# Patient Record
Sex: Male | Born: 1950 | Race: Black or African American | Hispanic: No | Marital: Married | Smoking: Former smoker
Health system: Southern US, Community
[De-identification: ages and names within clinical notes are randomized; demographics above are authoritative.]

## PROBLEM LIST (undated history)

## (undated) DIAGNOSIS — I10 Essential (primary) hypertension: Secondary | ICD-10-CM

---
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
## 2015-10-09 ENCOUNTER — Encounter (HOSPITAL_COMMUNITY): Payer: Self-pay | Admitting: *Deleted

## 2015-10-09 ENCOUNTER — Emergency Department (HOSPITAL_COMMUNITY)
Admission: EM | Admit: 2015-10-09 | Discharge: 2015-10-09 | Disposition: A | Discharge status: Home / Self Care | Payer: BLUE CROSS/BLUE SHIELD | Attending: Emergency Medicine | Admitting: Emergency Medicine

## 2015-10-09 ENCOUNTER — Emergency Department (HOSPITAL_COMMUNITY): Payer: BLUE CROSS/BLUE SHIELD

## 2015-10-09 DIAGNOSIS — F17200 Nicotine dependence, unspecified, uncomplicated: Secondary | ICD-10-CM | POA: Insufficient documentation

## 2015-10-09 DIAGNOSIS — J209 Acute bronchitis, unspecified: Secondary | ICD-10-CM | POA: Insufficient documentation

## 2015-10-09 DIAGNOSIS — R0602 Shortness of breath: Secondary | ICD-10-CM | POA: Diagnosis present

## 2015-10-09 LAB — CBC WITH DIFFERENTIAL/PLATELET
BASOS ABS: 0 10*3/uL (ref 0.0–0.1)
BASOS PCT: 0 %
EOS ABS: 0.2 10*3/uL (ref 0.0–0.7)
EOS PCT: 3 %
HCT: 38.6 % — ABNORMAL LOW (ref 39.0–52.0)
HEMOGLOBIN: 12.7 g/dL — AB (ref 13.0–17.0)
LYMPHS ABS: 3.8 10*3/uL (ref 0.7–4.0)
LYMPHS PCT: 50 %
MCH: 28.2 pg (ref 26.0–34.0)
MCHC: 32.9 g/dL (ref 30.0–36.0)
MCV: 85.6 fL (ref 78.0–100.0)
MONOS PCT: 7 %
Monocytes Absolute: 0.6 10*3/uL (ref 0.1–1.0)
NEUTROS PCT: 40 %
Neutro Abs: 3.1 10*3/uL (ref 1.7–7.7)
PLATELETS: 306 10*3/uL (ref 150–400)
RBC: 4.51 MIL/uL (ref 4.22–5.81)
RDW: 15.7 % — ABNORMAL HIGH (ref 11.5–15.5)
WBC: 7.7 10*3/uL (ref 4.0–10.5)

## 2015-10-09 LAB — COMPREHENSIVE METABOLIC PANEL
ALT: 14 U/L — AB (ref 17–63)
AST: 19 U/L (ref 15–41)
Albumin: 3.9 g/dL (ref 3.5–5.0)
Alkaline Phosphatase: 44 U/L (ref 38–126)
Anion gap: 9 (ref 5–15)
BUN: 16 mg/dL (ref 6–20)
CHLORIDE: 104 mmol/L (ref 101–111)
CO2: 26 mmol/L (ref 22–32)
CREATININE: 0.93 mg/dL (ref 0.61–1.24)
Calcium: 9 mg/dL (ref 8.9–10.3)
GFR calc non Af Amer: >60 mL/min (ref >60)
Glucose, Bld: 115 mg/dL — ABNORMAL HIGH (ref 65–99)
POTASSIUM: 4 mmol/L (ref 3.5–5.1)
SODIUM: 139 mmol/L (ref 135–145)
Total Bilirubin: 0.5 mg/dL (ref 0.3–1.2)
Total Protein: 7.5 g/dL (ref 6.5–8.1)

## 2015-10-09 LAB — BRAIN NATRIURETIC PEPTIDE: B NATRIURETIC PEPTIDE 5: 26 pg/mL (ref 0.0–100.0)

## 2015-10-09 LAB — TROPONIN I

## 2015-10-09 MED ORDER — IPRATROPIUM-ALBUTEROL 0.5-2.5 (3) MG/3ML IN SOLN
3.0000 mL | Freq: Once | RESPIRATORY_TRACT | Status: AC
  Administered 2015-10-09: 3 mL via RESPIRATORY_TRACT

## 2015-10-09 MED ORDER — ALBUTEROL SULFATE HFA 108 (90 BASE) MCG/ACT IN AERS
2.0000 | INHALATION_SPRAY | RESPIRATORY_TRACT | Status: DC | PRN
  Administered 2015-10-09: 2 via RESPIRATORY_TRACT
  Filled 2015-10-09: qty 6.7

## 2015-10-09 MED ORDER — ALBUTEROL SULFATE (2.5 MG/3ML) 0.083% IN NEBU
2.5000 mg | INHALATION_SOLUTION | Freq: Once | RESPIRATORY_TRACT | Status: AC
  Administered 2015-10-09: 2.5 mg via RESPIRATORY_TRACT

## 2015-10-09 MED ORDER — AEROCHAMBER Z-STAT PLUS/MEDIUM MISC: 1.0000 | Freq: Once | Status: DC

## 2015-10-09 MED ORDER — IPRATROPIUM-ALBUTEROL 0.5-2.5 (3) MG/3ML IN SOLN
3.0000 mL | Freq: Once | RESPIRATORY_TRACT | Status: AC
Start: 2015-10-09 — End: 2015-10-09
  Filled 2015-10-09: qty 3

## 2015-10-09 MED ORDER — AZITHROMYCIN 250 MG PO TABS: ORAL_TABLET | ORAL | Status: DC

## 2015-10-09 MED ORDER — PREDNISONE 20 MG PO TABS: ORAL_TABLET | ORAL | Status: DC

## 2015-10-09 MED ORDER — ALBUTEROL SULFATE (2.5 MG/3ML) 0.083% IN NEBU: 5.0000 mg | INHALATION_SOLUTION | Freq: Once | RESPIRATORY_TRACT | Status: DC

## 2015-10-09 MED ORDER — IPRATROPIUM-ALBUTEROL 0.5-2.5 (3) MG/3ML IN SOLN
RESPIRATORY_TRACT | Status: AC
  Administered 2015-10-09: 3 mL via RESPIRATORY_TRACT
  Filled 2015-10-09: qty 3

## 2015-10-09 MED ORDER — PREDNISONE 50 MG PO TABS
60.0000 mg | ORAL_TABLET | Freq: Once | ORAL | Status: AC
  Administered 2015-10-09: 60 mg via ORAL
  Filled 2015-10-09: qty 1

## 2015-10-09 MED ORDER — ALBUTEROL SULFATE (2.5 MG/3ML) 0.083% IN NEBU
INHALATION_SOLUTION | RESPIRATORY_TRACT | Status: AC
  Administered 2015-10-09: 2.5 mg via RESPIRATORY_TRACT
  Filled 2015-10-09: qty 3

## 2015-10-09 MED ORDER — IPRATROPIUM BROMIDE 0.02 % IN SOLN: 0.5000 mg | Freq: Once | RESPIRATORY_TRACT | Status: DC

## 2015-10-09 NOTE — ED Notes (Signed)
Pt c/o sob that started x 1 hour ago; pt states he has had a cold but tonight he became severely sob; pt states it feels like his throat is closing up

## 2015-10-09 NOTE — Discharge Instructions (Signed)
Drink plenty of fluids. You can take Mucinex DM over-the-counter for cough. Use the inhaler for wheezing or shortness of breath. Take the antibiotics until gone. Return to the ED if you feel worse such as getting a high fever, worsening shortness of breath, or chest pain.

## 2015-10-09 NOTE — ED Provider Notes (Signed)
CSN: [id number]     Arrival date & time 10/09/15  0009 History  By signing my name below, I, Hansel Feinstein, attest that this documentation has been prepared under the direction and in the presence of Rolland Porter, MD at [facility]. Electronically Signed: Hansel Feinstein, ED Scribe. 10/09/2015. 12:52 AM.    Chief Complaint  Patient presents with  . Shortness of Breath   The history is provided by the patient. No language interpreter was used.    HPI Comments: Carlos Vargas is a 64 y.o. male who presents to the Emergency Department complaining of moderate, intermittent, productive cough with white phlegm onset 2 weeks ago. He states associated SOB onset tonight that woke him from sleep at 2330, generalized chest and throat tightness onset this morning, wheezing, rhinorrhea with clear drainage, mild sore throat. No Hx of similar symptoms or wheezing. Pt states significant improvement of his symptoms with a breathing treatment administered in the ED. No daily medications. Pt is a daily smoker, 3 cigarettes/day. Pt does environmental work.  He denies fever, nausea, vomiting, diarrhea, abdominal pain, leg swelling.   PCP none  History reviewed. No pertinent past medical history. History reviewed. No pertinent past surgical history. History reviewed. No pertinent family history. Social History  Substance Use Topics  . Smoking status: Current Every Day Smoker -- 0.33 packs/day  . Smokeless tobacco: None  . Alcohol Use: No  employed Smokes 3 cigarettes a day  Review of Systems  Constitutional: Negative for fever.  HENT: Positive for rhinorrhea and sore throat.   Respiratory: Positive for cough, chest tightness, shortness of breath and wheezing.   Cardiovascular: Negative for leg swelling.  Gastrointestinal: Negative for nausea, vomiting and diarrhea.  All other systems reviewed and are negative.  Allergies  Review of patient's allergies indicates no known allergies.  Home Medications   Prior to Admission  medications   Medication Sig Start Date End Date Taking? Authorizing Provider  azithromycin (ZITHROMAX) 250 MG tablet Take 2 po the first day then once a day for the next 4 days. 10/09/15   Rolland Porter, MD  predniSONE (DELTASONE) 20 MG tablet Take 3 po QD x 3d , then 2 po QD x 3d then 1 po QD x 3d 10/09/15   Rolland Porter, MD   Triage VS: BP 156/87 mmHg  Pulse 64  Temp(Src) 98.2 F (36.8 C) (Oral)  Resp 20  SpO2 95% on RA  Vital signs normal   Physical Exam  Constitutional: He is oriented to person, place, and time. He appears well-developed and well-nourished.  Non-toxic appearance. He does not appear ill. No distress.  HENT:  Head: Normocephalic and atraumatic.  Right Ear: External ear normal.  Left Ear: External ear normal.  Nose: Nose normal. No mucosal edema or rhinorrhea.  Mouth/Throat: Oropharynx is clear and moist and mucous membranes are normal. No dental abscesses or uvula swelling.  Eyes: Conjunctivae and EOM are normal. Pupils are equal, round, and reactive to light.  Neck: Normal range of motion and full passive range of motion without pain. Neck supple.  Cardiovascular: Normal rate, regular rhythm and normal heart sounds.  Exam reveals no gallop and no friction rub.   No murmur heard. Pulmonary/Chest: Effort normal. No respiratory distress. He has wheezes. He has no rhonchi. He has no rales. He exhibits no tenderness and no crepitus.  My exam was after his first nebulizer treatment. Diffuse expiratory wheezing.   Abdominal: Soft. Normal appearance and bowel sounds are normal. He exhibits no distension. There  is no tenderness. There is no rebound and no guarding.  Musculoskeletal: Normal range of motion. He exhibits no edema or tenderness.  Moves all extremities well.   Neurological: He is alert and oriented to person, place, and time. He has normal strength. No cranial nerve deficit.  Skin: Skin is warm, dry and intact. No rash noted. No erythema. No pallor.  Psychiatric: He  has a normal mood and affect. His speech is normal and behavior is normal. His mood appears not anxious.  Nursing note and vitals reviewed.  ED Course  Procedures (including critical care time)  Medications  albuterol (PROVENTIL HFA;VENTOLIN HFA) 108 (90 BASE) MCG/ACT inhaler 2 puff (not administered)  aerochamber Z-Stat Plus/medium 1 each (not administered)  ipratropium-albuterol (DUONEB) 0.5-2.5 (3) MG/3ML nebulizer solution 3 mL (3 mLs Nebulization Given 10/09/15 0036)  ipratropium-albuterol (DUONEB) 0.5-2.5 (3) MG/3ML nebulizer solution 3 mL (0 mLs Nebulization Duplicate [id number])  predniSONE (DELTASONE) tablet 60 mg (60 mg Oral Given 10/09/15 0122)  ipratropium-albuterol (DUONEB) 0.5-2.5 (3) MG/3ML nebulizer solution 3 mL (3 mLs Nebulization Given 10/09/15 0055)  albuterol (PROVENTIL) (2.5 MG/3ML) 0.083% nebulizer solution 2.5 mg (2.5 mg Nebulization Given 10/09/15 0055)    DIAGNOSTIC STUDIES: Oxygen Saturation is 95% on [place] 2L/min, adequate by my interpretation.    COORDINATION OF CARE: 12:46 AM Discussed treatment plan with pt at bedside and pt agreed to plan. Patient had RD received one nebulizer by nursing staff. He received a second nebulizer plus prednisone. He had a chest x-ray ordered to rule out pneumonia.   Recheck at 1:40 AM patient states he's feeling much better. His lungs are now clear. Patient is eager to be discharged. He was discharged home with inhaler. He was discharged with prednisone and a Z-Pak.   Labs Review Results for orders placed or performed during the hospital encounter of 10/09/15  Comprehensive metabolic panel  Result Value Ref Range   Sodium 139 135 - 145 mmol/L   Potassium 4.0 3.5 - 5.1 mmol/L   Chloride 104 101 - 111 mmol/L   CO2 26 22 - 32 mmol/L   Glucose, Bld 115 (H) 65 - 99 mg/dL   BUN 16 6 - 20 mg/dL   Creatinine, Ser 0.93 0.61 - 1.24 mg/dL   Calcium 9.0 8.9 - 10.3 mg/dL   Total Protein 7.5 6.5 - 8.1 g/dL   Albumin 3.9 3.5 - 5.0 g/dL    AST 19 15 - 41 U/L   ALT 14 (L) 17 - 63 U/L   Alkaline Phosphatase 44 38 - 126 U/L   Total Bilirubin 0.5 0.3 - 1.2 mg/dL   GFR calc non Af Amer >60 >60 mL/min   GFR calc Af Amer >60 >60 mL/min   Anion gap 9 5 - 15  CBC with Differential  Result Value Ref Range   WBC 7.7 4.0 - 10.5 K/uL   RBC 4.51 4.22 - 5.81 MIL/uL   Hemoglobin 12.7 (L) 13.0 - 17.0 g/dL   HCT 38.6 (L) 39.0 - 52.0 %   MCV 85.6 78.0 - 100.0 fL   MCH 28.2 26.0 - 34.0 pg   MCHC 32.9 30.0 - 36.0 g/dL   RDW 15.7 (H) 11.5 - 15.5 %   Platelets 306 150 - 400 K/uL   Neutrophils Relative % 40 %   Neutro Abs 3.1 1.7 - 7.7 K/uL   Lymphocytes Relative 50 %   Lymphs Abs 3.8 0.7 - 4.0 K/uL   Monocytes Relative 7 %   Monocytes Absolute 0.6 0.1 -  1.0 K/uL   Eosinophils Relative 3 %   Eosinophils Absolute 0.2 0.0 - 0.7 K/uL   Basophils Relative 0 %   Basophils Absolute 0.0 0.0 - 0.1 K/uL  Troponin I  Result Value Ref Range   Troponin I <0.03 <0.031 ng/mL  Brain natriuretic peptide  Result Value Ref Range   B Natriuretic Peptide 26.0 0.0 - 100.0 pg/mL   Laboratory interpretation all normal except anemia     Imaging Review Dg Chest 2 View  10/09/2015  CLINICAL DATA:  Subacute onset of moderate intermittent productive cough, and acute shortness of breath. Generalized chest and throat tightness. Wheezing and rhinorrhea. Mild sore throat. Initial encounter. EXAM: CHEST  2 VIEW COMPARISON:  None. FINDINGS: The lungs are well-aerated. Mild peribronchial thickening is noted. There is no evidence of focal opacification, pleural effusion or pneumothorax. The heart is normal in size; the mediastinal contour is within normal limits. No acute osseous abnormalities are seen. IMPRESSION: Mild peribronchial thickening noted.  Lungs otherwise clear. Electronically Signed   By: Garald Balding M.D.   On: 10/09/2015 01:07   I have personally reviewed and evaluated these images and lab results as part of my medical decision-making.   EKG  Interpretation   Date/Time:  Tuesday October 09 2015 00:29:00 EST Ventricular Rate:  71 PR Interval:  216 QRS Duration: 90 QT Interval:  368 QTC Calculation: 400 R Axis:   80 Text Interpretation:  Sinus rhythm Borderline prolonged PR interval  Borderline low voltage, extremity leads Anteroseptal infarct, old Baseline  wander No old tracing to compare Confirmed by   MD-I,  ([id number]) on  10/09/2015 12:32:12 AM      MDM   patient presents with bronchitis for about 2 weeks now with bronchospasm which he's never had before. He improved rapidly with a albuterol/Atrovent nebulizer treatments 2. Labs were done he does not have evidence of congestive heart failure either on blood work or x-ray or acute myocardial event. Patient was discharged with an inhaler and antibiotics with steroids for his bronchospasm    Final diagnoses:  Bronchitis, acute, with bronchospasm   New Prescriptions   AZITHROMYCIN (ZITHROMAX) 250 MG TABLET    Take 2 po the first day then once a day for the next 4 days.   PREDNISONE (DELTASONE) 20 MG TABLET    Take 3 po QD x 3d , then 2 po QD x 3d then 1 po QD x 3d    Plan discharge  Rolland Porter, MD, FACEP   I personally performed the services described in this documentation, which was scribed in my presence. The recorded information has been reviewed and considered.  Rolland Porter, MD, Barbette Or, MD 10/09/15 [phone]

## 2017-07-20 ENCOUNTER — Encounter (HOSPITAL_COMMUNITY): Payer: Self-pay | Admitting: Emergency Medicine

## 2017-07-20 DIAGNOSIS — Z5321 Procedure and treatment not carried out due to patient leaving prior to being seen by health care provider: Secondary | ICD-10-CM | POA: Insufficient documentation

## 2017-07-20 DIAGNOSIS — K0889 Other specified disorders of teeth and supporting structures: Secondary | ICD-10-CM | POA: Diagnosis present

## 2017-07-20 NOTE — ED Triage Notes (Signed)
Pt c/o left sided dental pain that started today. He has been taking aleve with no relief.

## 2017-07-21 ENCOUNTER — Emergency Department (HOSPITAL_COMMUNITY)
Admission: EM | Admit: 2017-07-21 | Discharge: 2017-07-21 | Disposition: A | Discharge status: Home / Self Care | Payer: BLUE CROSS/BLUE SHIELD | Attending: Emergency Medicine | Admitting: Emergency Medicine

## 2021-04-09 ENCOUNTER — Emergency Department (HOSPITAL_COMMUNITY)
Admission: EM | Admit: 2021-04-09 | Discharge: 2021-04-09 | Disposition: A | Discharge status: Home / Self Care | Payer: Medicare HMO | Attending: Emergency Medicine | Admitting: Emergency Medicine

## 2021-04-09 ENCOUNTER — Emergency Department (HOSPITAL_COMMUNITY): Payer: Medicare HMO

## 2021-04-09 ENCOUNTER — Other Ambulatory Visit: Payer: Self-pay

## 2021-04-09 ENCOUNTER — Encounter (HOSPITAL_COMMUNITY): Payer: Self-pay | Admitting: Emergency Medicine

## 2021-04-09 DIAGNOSIS — R0602 Shortness of breath: Secondary | ICD-10-CM | POA: Insufficient documentation

## 2021-04-09 DIAGNOSIS — R0789 Other chest pain: Secondary | ICD-10-CM | POA: Insufficient documentation

## 2021-04-09 DIAGNOSIS — Z20822 Contact with and (suspected) exposure to covid-19: Secondary | ICD-10-CM | POA: Insufficient documentation

## 2021-04-09 DIAGNOSIS — R062 Wheezing: Secondary | ICD-10-CM | POA: Diagnosis not present

## 2021-04-09 DIAGNOSIS — Z87891 Personal history of nicotine dependence: Secondary | ICD-10-CM | POA: Insufficient documentation

## 2021-04-09 LAB — CBC WITH DIFFERENTIAL/PLATELET
Abs Immature Granulocytes: 0.01 10*3/uL (ref 0.00–0.07)
Basophils Absolute: 0 10*3/uL (ref 0.0–0.1)
Basophils Relative: 1 %
Eosinophils Absolute: 0.3 10*3/uL (ref 0.0–0.5)
Eosinophils Relative: 6 %
HCT: 41.4 % (ref 39.0–52.0)
Hemoglobin: 13.3 g/dL (ref 13.0–17.0)
Immature Granulocytes: 0 %
Lymphocytes Relative: 41 %
Lymphs Abs: 2.1 10*3/uL (ref 0.7–4.0)
MCH: 28.5 pg (ref 26.0–34.0)
MCHC: 32.1 g/dL (ref 30.0–36.0)
MCV: 88.7 fL (ref 80.0–100.0)
Monocytes Absolute: 0.5 10*3/uL (ref 0.1–1.0)
Monocytes Relative: 10 %
Neutro Abs: 2.2 10*3/uL (ref 1.7–7.7)
Neutrophils Relative %: 42 %
Platelets: 230 10*3/uL (ref 150–400)
RBC: 4.67 MIL/uL (ref 4.22–5.81)
RDW: 14.3 % (ref 11.5–15.5)
WBC: 5.2 10*3/uL (ref 4.0–10.5)
nRBC: 0 % (ref 0.0–0.2)

## 2021-04-09 LAB — BASIC METABOLIC PANEL
Anion gap: 6 (ref 5–15)
BUN: 10 mg/dL (ref 8–23)
CO2: 30 mmol/L (ref 22–32)
Calcium: 9 mg/dL (ref 8.9–10.3)
Chloride: 104 mmol/L (ref 98–111)
Creatinine, Ser: 0.88 mg/dL (ref 0.61–1.24)
GFR, Estimated: >60 mL/min (ref >60)
Glucose, Bld: 102 mg/dL — ABNORMAL HIGH (ref 70–99)
Potassium: 4.7 mmol/L (ref 3.5–5.1)
Sodium: 140 mmol/L (ref 135–145)

## 2021-04-09 LAB — RESP PANEL BY RT-PCR (FLU A&B, COVID) ARPGX2
Influenza A by PCR: NEGATIVE
Influenza B by PCR: NEGATIVE
SARS Coronavirus 2 by RT PCR: NEGATIVE

## 2021-04-09 LAB — TROPONIN I (HIGH SENSITIVITY): Troponin I (High Sensitivity): 2 ng/L (ref <18)

## 2021-04-09 LAB — BRAIN NATRIURETIC PEPTIDE: B Natriuretic Peptide: 18 pg/mL (ref 0.0–100.0)

## 2021-04-09 MED ORDER — PREDNISONE 50 MG PO TABS
60.0000 mg | ORAL_TABLET | Freq: Once | ORAL | Status: AC
  Administered 2021-04-09: 60 mg via ORAL
  Filled 2021-04-09: qty 1

## 2021-04-09 MED ORDER — ALBUTEROL SULFATE (2.5 MG/3ML) 0.083% IN NEBU
5.0000 mg | INHALATION_SOLUTION | Freq: Once | RESPIRATORY_TRACT | Status: AC
  Administered 2021-04-09: 5 mg via RESPIRATORY_TRACT

## 2021-04-09 MED ORDER — ALBUTEROL SULFATE HFA 108 (90 BASE) MCG/ACT IN AERS: 2.0000 | INHALATION_SPRAY | RESPIRATORY_TRACT | 1 refills | Status: DC | PRN

## 2021-04-09 MED ORDER — ALBUTEROL SULFATE HFA 108 (90 BASE) MCG/ACT IN AERS
2.0000 | INHALATION_SPRAY | RESPIRATORY_TRACT | Status: DC | PRN
  Administered 2021-04-09: 2 via RESPIRATORY_TRACT
  Filled 2021-04-09: qty 6.7

## 2021-04-09 MED ORDER — ALBUTEROL SULFATE (2.5 MG/3ML) 0.083% IN NEBU
5.0000 mg | INHALATION_SOLUTION | Freq: Once | RESPIRATORY_TRACT | Status: AC
  Administered 2021-04-09: 5 mg via RESPIRATORY_TRACT
  Filled 2021-04-09: qty 6

## 2021-04-09 MED ORDER — IPRATROPIUM BROMIDE 0.02 % IN SOLN
0.5000 mg | Freq: Once | RESPIRATORY_TRACT | Status: AC
  Administered 2021-04-09: 0.5 mg via RESPIRATORY_TRACT

## 2021-04-09 MED ORDER — IPRATROPIUM BROMIDE 0.02 % IN SOLN
0.5000 mg | Freq: Once | RESPIRATORY_TRACT | Status: AC
  Administered 2021-04-09: 0.5 mg via RESPIRATORY_TRACT
  Filled 2021-04-09: qty 2.5

## 2021-04-09 NOTE — ED Provider Notes (Addendum)
[facility] EMERGENCY DEPARTMENT Provider Note   CSN: [id number] Arrival date & time: 04/09/21  5573     History Chief Complaint  Patient presents with  . Shortness of Breath    Carlos Vargas is a 70 y.o. male.  Patient c/o sob onset this AM. States hx intermittent wheezing in past, but has no inhaler. Early this AM felt wheezing, sob, and chest tightness. Symptoms at rest, acute onset, moderate, constant, persistent. No exertional chest pain or discomfort. No pleuritic pain. No leg pain or swelling. Occasional non prod cough. No sore throat. No fever or chills. No specific known ill contacts. Occasional smoker. Denies any other recent chest pain or discomfort, no unusual doe or fatigue. No associated nv or diaphoresis. No recent surgery, travel, trauma or immobility. No hemoptysis.   The history is provided by the patient.  Shortness of Breath Associated symptoms: chest pain, cough and wheezing   Associated symptoms: no abdominal pain, no fever, no headaches, no neck pain, no rash, no sore throat and no vomiting        History reviewed. No pertinent past medical history.  There are no problems to display for this patient.   History reviewed. No pertinent surgical history.     No family history on file.  Social History   Tobacco Use  . Smoking status: Former Smoker    Packs/day: 0.33  . Smokeless tobacco: Never Used  Substance Use Topics  . Alcohol use: No  . Drug use: No    Home Medications Prior to Admission medications   Medication Sig Start Date End Date Taking? Authorizing Provider  azithromycin (ZITHROMAX) 250 MG tablet Take 2 po the first day then once a day for the next 4 days. 10/09/15   Rolland Porter, MD  predniSONE (DELTASONE) 20 MG tablet Take 3 po QD x 3d , then 2 po QD x 3d then 1 po QD x 3d 10/09/15   Rolland Porter, MD    Allergies    Patient has no known allergies.  Review of Systems   Review of Systems  Constitutional: Negative for chills and fever.   HENT: Negative for sore throat.   Eyes: Negative for redness.  Respiratory: Positive for cough, shortness of breath and wheezing.   Cardiovascular: Positive for chest pain. Negative for leg swelling.  Gastrointestinal: Negative for abdominal pain, nausea and vomiting.  Genitourinary: Negative for flank pain.  Musculoskeletal: Negative for back pain and neck pain.  Skin: Negative for rash.  Neurological: Negative for headaches.  Hematological: Does not bruise/bleed easily.  Psychiatric/Behavioral: Negative for confusion.    Physical Exam Updated Vital Signs BP 137/75   Pulse (!) 45   Temp 97.7 F (36.5 C) (Oral)   Resp 15   Ht 1.829 m (6')   Wt 81.6 kg   SpO2 97%   BMI 24.41 kg/m   Physical Exam Vitals and nursing note reviewed.  Constitutional:      Appearance: Normal appearance. He is well-developed.  HENT:     Head: Atraumatic.     Nose: Nose normal.     Mouth/Throat:     Mouth: Mucous membranes are moist.     Pharynx: Oropharynx is clear.  Eyes:     General: No scleral icterus.    Conjunctiva/sclera: Conjunctivae normal.  Neck:     Trachea: No tracheal deviation.  Cardiovascular:     Rate and Rhythm: Normal rate and regular rhythm.     Pulses: Normal pulses.     Heart  sounds: Normal heart sounds. No murmur heard. No friction rub. No gallop.   Pulmonary:     Effort: Pulmonary effort is normal. No accessory muscle usage or respiratory distress.     Breath sounds: Wheezing present.  Abdominal:     General: Bowel sounds are normal. There is no distension.     Palpations: Abdomen is soft.     Tenderness: There is no abdominal tenderness. There is no guarding.  Genitourinary:    Comments: No cva tenderness. Musculoskeletal:        General: No swelling or tenderness.     Cervical back: Normal range of motion and neck supple. No rigidity.     Right lower leg: No edema.     Left lower leg: No edema.  Skin:    General: Skin is warm and dry.     Findings: No  rash.  Neurological:     Mental Status: He is alert.     Comments: Alert, speech clear.   Psychiatric:        Mood and Affect: Mood normal.     ED Results / Procedures / Treatments   Labs (all labs ordered are listed, but only abnormal results are displayed) Results for orders placed or performed during the hospital encounter of 04/09/21  Resp Panel by RT-PCR (Flu A&B, Covid) Nasopharyngeal Swab   Specimen: Nasopharyngeal Swab; Nasopharyngeal(NP) swabs in vial transport medium  Result Value Ref Range   SARS Coronavirus 2 by RT PCR NEGATIVE NEGATIVE   Influenza A by PCR NEGATIVE NEGATIVE   Influenza B by PCR NEGATIVE NEGATIVE  Basic metabolic panel  Result Value Ref Range   Sodium 140 135 - 145 mmol/L   Potassium 4.7 3.5 - 5.1 mmol/L   Chloride 104 98 - 111 mmol/L   CO2 30 22 - 32 mmol/L   Glucose, Bld 102 (H) 70 - 99 mg/dL   BUN 10 8 - 23 mg/dL   Creatinine, Ser 0.88 0.61 - 1.24 mg/dL   Calcium 9.0 8.9 - 10.3 mg/dL   GFR, Estimated >60 >60 mL/min   Anion gap 6 5 - 15  CBC with Differential/Platelet  Result Value Ref Range   WBC 5.2 4.0 - 10.5 K/uL   RBC 4.67 4.22 - 5.81 MIL/uL   Hemoglobin 13.3 13.0 - 17.0 g/dL   HCT 41.4 39.0 - 52.0 %   MCV 88.7 80.0 - 100.0 fL   MCH 28.5 26.0 - 34.0 pg   MCHC 32.1 30.0 - 36.0 g/dL   RDW 14.3 11.5 - 15.5 %   Platelets 230 150 - 400 K/uL   nRBC 0.0 0.0 - 0.2 %   Neutrophils Relative % 42 %   Neutro Abs 2.2 1.7 - 7.7 K/uL   Lymphocytes Relative 41 %   Lymphs Abs 2.1 0.7 - 4.0 K/uL   Monocytes Relative 10 %   Monocytes Absolute 0.5 0.1 - 1.0 K/uL   Eosinophils Relative 6 %   Eosinophils Absolute 0.3 0.0 - 0.5 K/uL   Basophils Relative 1 %   Basophils Absolute 0.0 0.0 - 0.1 K/uL   Immature Granulocytes 0 %   Abs Immature Granulocytes 0.01 0.00 - 0.07 K/uL  Brain natriuretic peptide  Result Value Ref Range   B Natriuretic Peptide 18.0 0.0 - 100.0 pg/mL  Troponin I (High Sensitivity)  Result Value Ref Range   Troponin I (High  Sensitivity) 2 <18 ng/L   DG Chest 2 View  Result Date: 04/09/2021 CLINICAL DATA:  Shortness of breath. EXAM:  CHEST - 2 VIEW COMPARISON:  10/09/2015. FINDINGS: Mediastinum and hilar structures normal. Heart size normal. Mild atelectatic changes noted in the right middle lobe, best identified on lateral view. Small nodular opacity noted over the left base, possibly nipple shadow. Repeat chest x-ray with nipple markers suggested. No pleural effusion or pneumothorax. Degenerative change thoracic spine. IMPRESSION: 1. Mild atelectatic changes noted in the right middle lobe, best identified on lateral view. 2. Small nodular opacity noted the left base, possibly nipple shadow. Repeat chest x-ray with nipple markers suggested. Exam otherwise unremarkable. Electronically Signed   By: Marcello Moores  Register   On: 04/09/2021 06:06    EKG EKG Interpretation  Date/Time:  Tuesday April 09 2021 05:37:52 EDT Ventricular Rate:  49 PR Interval:  217 QRS Duration: 95 QT Interval:  443 QTC Calculation: 400 R Axis:   48 Text Interpretation: Sinus bradycardia Probable anteroseptal infarct, old Nonspecific T abnormalities, lateral leads Interpretation limited secondary to artifact Confirmed by Ripley Fraise [phone]) on 04/09/2021 5:46:56 AM   Radiology DG Chest 2 View  Result Date: 04/09/2021 CLINICAL DATA:  Shortness of breath. EXAM: CHEST - 2 VIEW COMPARISON:  10/09/2015. FINDINGS: Mediastinum and hilar structures normal. Heart size normal. Mild atelectatic changes noted in the right middle lobe, best identified on lateral view. Small nodular opacity noted over the left base, possibly nipple shadow. Repeat chest x-ray with nipple markers suggested. No pleural effusion or pneumothorax. Degenerative change thoracic spine. IMPRESSION: 1. Mild atelectatic changes noted in the right middle lobe, best identified on lateral view. 2. Small nodular opacity noted the left base, possibly nipple shadow. Repeat chest x-ray with nipple  markers suggested. Exam otherwise unremarkable. Electronically Signed   By: Marcello Moores  Register   On: 04/09/2021 06:06    Procedures Procedures   Medications Ordered in ED Medications  albuterol (VENTOLIN HFA) 108 (90 Base) MCG/ACT inhaler 2 puff (2 puffs Inhalation Given 04/09/21 0724)  albuterol (PROVENTIL) (2.5 MG/3ML) 0.083% nebulizer solution 5 mg (has no administration in time range)  ipratropium (ATROVENT) nebulizer solution 0.5 mg (has no administration in time range)  albuterol (PROVENTIL) (2.5 MG/3ML) 0.083% nebulizer solution 5 mg (has no administration in time range)  ipratropium (ATROVENT) nebulizer solution 0.5 mg (has no administration in time range)  predniSONE (DELTASONE) tablet 60 mg (60 mg Oral Given 04/09/21 3300)    ED Course  I have reviewed the triage vital signs and the nursing notes.  Pertinent labs & imaging results that were available during my care of the patient were reviewed by me and considered in my medical decision making (see chart for details).    MDM Rules/Calculators/A&P                         Iv ns. Continuous pulse ox and cardiac monitoring. Stat labs. Imaging. Ecg.   Albuterol/atrovent tx.   Reviewed nursing notes and prior charts for additional history.   Labs reviewed/interpreted by me - trop normal. covid neg.   CXR reviewed/interpreted by me - no pna. ?left base nodule - shared w pt, rec outpt f/u.   Recheck pt, no wheezing. No cp or sob.   Recheck pt, indicates is symptom free and requests d/c, does not want to stay for additional testing, delta trop, etc.   Pt remains symptom free and continues to request d/c without further testing.   Rec pcp/card f/u.      Final Clinical Impression(s) / ED Diagnoses Final diagnoses:  None    Rx /  DC Orders ED Discharge Orders    None           Lajean Saver, MD 04/09/21 1027

## 2021-04-09 NOTE — Discharge Instructions (Addendum)
It was our pleasure to provide your ER care today - we hope that you feel better.  Use albuterol treatment as need. Avoid any smoking.   Follow up with primary care doctor in the coming week - your chest xray was read as showing 'nodular density left lower lung' - discuss with primary care doctor at follow up, and have them repeat a chest xray in next 1-2 months.   For chest tightness, follow up with cardiologist in the next 1-2 weeks.   Return to ER if worse, new symptoms, fevers, increased trouble breathing, recurrent or persistent chest pain, or other concern.

## 2021-04-09 NOTE — ED Notes (Signed)
When getting out of wheelchair and to the bed pt was 95% on room air .

## 2021-04-09 NOTE — ED Notes (Signed)
When asking patient about bradycardia pt was told his heart rate usually stays low

## 2021-04-09 NOTE — ED Notes (Signed)
Dr. Kathlynn Grate pt and was notified of low hr around 40's.

## 2021-04-09 NOTE — ED Notes (Signed)
Covid test taken to lab.

## 2021-04-09 NOTE — ED Triage Notes (Signed)
Pt c/o waking up with sob and wheezing.

## 2021-04-09 NOTE — ED Provider Notes (Signed)
Emergency Medicine Provider Triage Evaluation Note  Lorin Gawron , a 70 y.o. male  was evaluated in triage.  Pt complains of shortness of breath and wheezing.  Review of Systems  Positive: Cough/wheezing Negative: feve  Physical Exam  BP (!) 171/90   Pulse 61   Temp 97.7 F (36.5 C) (Oral)   Resp 18   Ht 1.829 m (6')   Wt 81.6 kg   SpO2 95%   BMI 24.41 kg/m  Gen:   Awake, mild tachypnea Resp:  Wheezing bilaterally MSK:   Moves extremities without difficulty    Medical Decision Making  Medically screening exam initiated at 6:22 AM.  Appropriate orders placed.  Vibhav Waddill was informed that the remainder of the evaluation will be completed by another provider, this initial triage assessment does not replace that evaluation, and the importance of remaining in the ED until their evaluation is complete.     Ripley Fraise, MD 04/09/21 [phone]

## 2021-04-16 NOTE — Progress Notes (Deleted)
Cardiology Office Note   Date:  04/16/2021   ID:  Carlos Vargas, DOB 04-18-1951, MRN [id number]  PCP:  Patient, No Pcp Per (Inactive)  Cardiologist:   None Referring:  ***  No chief complaint on file.     History of Present Illness: Carlos Vargas is a 70 y.o. male who presents for ***    Who was in the ED with SOB last week.   I reviewed these records for this visit.  .  Trop x 2 and BNP were negative.  EKG ***   No past medical history on file.  No past surgical history on file.   Current Outpatient Medications  Medication Sig Dispense Refill   albuterol (VENTOLIN HFA) 108 (90 Base) MCG/ACT inhaler Inhale 2 puffs into the lungs every 4 (four) hours as needed for wheezing or shortness of breath. 1 each 1   azithromycin (ZITHROMAX) 250 MG tablet Take 2 po the first day then once a day for the next 4 days. (Patient not taking: Reported on 04/09/2021) 6 tablet 0   predniSONE (DELTASONE) 20 MG tablet Take 3 po QD x 3d , then 2 po QD x 3d then 1 po QD x 3d (Patient not taking: Reported on 04/09/2021) 18 tablet 0   No current facility-administered medications for this visit.    Allergies:   Patient has no known allergies.    Social History:  The patient  reports that he has quit smoking. He smoked an average of 0.33 packs per day. He has never used smokeless tobacco. He reports that he does not drink alcohol and does not use drugs.   Family History:  The patient's ***family history is not on file.    ROS:  Please see the history of present illness.   Otherwise, review of systems are positive for {NONE DEFAULTED:18576}.   All other systems are reviewed and negative.    PHYSICAL EXAM: VS:  There were no vitals taken for this visit. , BMI There is no height or weight on file to calculate BMI. GENERAL:  Well appearing HEENT:  Pupils equal round and reactive, fundi not visualized, oral mucosa unremarkable NECK:  No jugular venous distention, waveform within normal limits, carotid  upstroke brisk and symmetric, no bruits, no thyromegaly LYMPHATICS:  No cervical, inguinal adenopathy LUNGS:  Clear to auscultation bilaterally BACK:  No CVA tenderness CHEST:  Unremarkable HEART:  PMI not displaced or sustained,S1 and S2 within normal limits, no S3, no S4, no clicks, no rubs, *** murmurs ABD:  Flat, positive bowel sounds normal in frequency in pitch, no bruits, no rebound, no guarding, no midline pulsatile mass, no hepatomegaly, no splenomegaly EXT:  2 plus pulses throughout, no edema, no cyanosis no clubbing SKIN:  No rashes no nodules NEURO:  Cranial nerves II through XII grossly intact, motor grossly intact throughout PSYCH:  Cognitively intact, oriented to person place and time    EKG:  EKG {ACTION; IS/IS DDU:20254270} ordered today. The ekg ordered today demonstrates ***   Recent Labs: 04/09/2021: B Natriuretic Peptide 18.0; BUN 10; Creatinine, Ser 0.88; Hemoglobin 13.3; Platelets 230; Potassium 4.7; Sodium 140    Lipid Panel No results found for: CHOL, TRIG, HDL, CHOLHDL, VLDL, LDLCALC, LDLDIRECT    Wt Readings from Last 3 Encounters:  04/09/21 180 lb (81.6 kg)  07/20/17 189 lb (85.7 kg)      Other studies Reviewed: Additional studies/ records that were reviewed today include: ***. Review of the above records demonstrates:  Please see  elsewhere in the note.  ***   ASSESSMENT AND PLAN:  SOB:  ***   Current medicines are reviewed at length with the patient today.  The patient {ACTIONS; HAS/DOES NOT HAVE:19233} concerns regarding medicines.  The following changes have been made:  {PLAN; NO CHANGE:13088:s}  Labs/ tests ordered today include: *** No orders of the defined types were placed in this encounter.    Disposition:   FU with ***    Signed, Minus Breeding, MD  04/16/2021 7:56 PM    [facility]

## 2021-04-17 ENCOUNTER — Ambulatory Visit: Payer: Medicare HMO | Admitting: Cardiology

## 2021-05-26 NOTE — Progress Notes (Signed)
CARDIOLOGY CONSULT NOTE       Patient ID: Carlos Vargas MRN: [id number] DOB/AGE: 1951-04-25 70 y.o.  Admit date: (Not on file) Referring Physician: Lajean Saver [facility] Primary Physician: Patient, No Pcp Per (Inactive) Primary Cardiologist: New Reason for Consultation: Dyspnea  Active Problems:   * No active hospital problems. *   HPI:  70 y.o. referred post visit [facility] Dr Ashok Cordia for dyspnea  Had intermittent wheezing and some tightness in chest Occasional smoker No previous history of CHF, MI, CAD.  Rx nebs and prednisone in ER with improvement CXR 04/09/21 atelectasis RML R/O BNP normal as was WBC and Hct Respiratory panel negative   He is a [place]. Retired from environmental work with [employer] exposures wore masks but denies Chronic lung disease or asthma 4 weeks ago went back to work locally [employer] as he was [name] his bike with no issues   Has older children out of state closest with grand daughter in [name] All other systems reviewed and negative except as noted above  History reviewed. No pertinent past medical history.  Family History  Problem Relation Age of Onset   Diabetes Mother    Hypertension Father    Diabetes Sister    COPD Brother    Alzheimer's disease Maternal Grandmother    Cancer Paternal Grandfather     Social History   Socioeconomic History   Marital status: Single    Spouse name: Not on file   Number of children: Not on file   Years of education: Not on file   Highest education level: Not on file  Occupational History   Not on file  Tobacco Use   Smoking status: Former    Packs/day: 0.33    Types: Cigarettes   Smokeless tobacco: Never  Vaping Use   Vaping Use: Never used  Substance and Sexual Activity   Alcohol use: No   Drug use: No   Sexual activity: Not on file  Other Topics Concern   Not on file  Social History Narrative   Not on file   Social Determinants of Health   Financial  Resource Strain: Not on file  Food Insecurity: Not on file  Transportation Needs: Not on file  Physical Activity: Not on file  Stress: Not on file  Social Connections: Not on file  Intimate Partner Violence: Not on file    Past Surgical History:  Procedure Laterality Date   CHEST SURGERY  1969   shrapnel from [place] war      Current Outpatient Medications:    albuterol (VENTOLIN HFA) 108 (90 Base) MCG/ACT inhaler, Inhale 2 puffs into the lungs every 4 (four) hours as needed for wheezing or shortness of breath., Disp: 1 each, Rfl: 1   azithromycin (ZITHROMAX) 250 MG tablet, Take 2 po the first day then once a day for the next 4 days. (Patient not taking: No sig reported), Disp: 6 tablet, Rfl: 0   predniSONE (DELTASONE) 20 MG tablet, Take 3 po QD x 3d , then 2 po QD x 3d then 1 po QD x 3d (Patient not taking: No sig reported), Disp: 18 tablet, Rfl: 0    Physical Exam: Blood pressure 118/70, pulse (!) 50, height '6\' 1"'$  (1.854 m), weight 82.1 kg, SpO2 97 %.    Affect appropriate Healthy:  appears stated age 70: normal Neck supple with no adenopathy JVP normal no bruits no thyromegaly Lungs clear with no wheezing and good diaphragmatic motion Heart:  S1/S2 no murmur, no rub, gallop or click PMI normal Abdomen: benighn, BS positve, no tenderness, no AAA no bruit.  No HSM or HJR Distal pulses intact with no bruits No edema Neuro non-focal Skin warm and dry No muscular weakness   Labs:   Lab Results  Component Value Date   WBC 5.2 04/09/2021   HGB 13.3 04/09/2021   HCT 41.4 04/09/2021   MCV 88.7 04/09/2021   PLT 230 04/09/2021   No results for input(s): NA, K, CL, CO2, BUN, CREATININE, CALCIUM, PROT, BILITOT, ALKPHOS, ALT, AST, GLUCOSE in the last 168 hours.  Invalid input(s): LABALBU Lab Results  Component Value Date   TROPONINI <0.03 10/09/2015   No results found for: CHOL No results found for: HDL No results found for: LDLCALC No results found for: TRIG No  results found for: CHOLHDL No results found for: LDLDIRECT    Radiology: No results found.  EKG: SB rate 49 poor R wave progression    ASSESSMENT AND PLAN:   Dyspnea:  seems more like bronchospastic lung dx D/c smoking continue albuterol finished with prednisone taper and Zithromax. Echo to assess RV/LV function  Chest Pain : related to wheezing ECG with poor R wave progression given baseline bradycardia will order exercise myovue r/o CAD and chronotropic incompetence   Echo  Ex Myovue F/U PRN if normal   Signed: Jenkins Rouge 05/29/2021, 10:14 AM

## 2021-05-29 ENCOUNTER — Encounter: Payer: Self-pay | Admitting: Cardiovascular Disease

## 2021-05-29 ENCOUNTER — Encounter: Payer: Self-pay | Admitting: *Deleted

## 2021-05-29 ENCOUNTER — Other Ambulatory Visit: Payer: Self-pay

## 2021-05-29 ENCOUNTER — Ambulatory Visit: Payer: Medicare Other | Admitting: Cardiovascular Disease

## 2021-05-29 VITALS — BP 118/70 | HR 50 | Ht 73.0 in | Wt 181.0 lb

## 2021-05-29 DIAGNOSIS — R0600 Dyspnea, unspecified: Secondary | ICD-10-CM | POA: Diagnosis not present

## 2021-05-29 DIAGNOSIS — R0609 Other forms of dyspnea: Secondary | ICD-10-CM

## 2021-05-29 DIAGNOSIS — R079 Chest pain, unspecified: Secondary | ICD-10-CM | POA: Diagnosis not present

## 2021-05-29 NOTE — Patient Instructions (Signed)
Medication Instructions:  Your physician recommends that you continue on your current medications as directed. Please refer to the Current Medication list given to you today.  *If you need a refill on your cardiac medications before your next appointment, please call your pharmacy*   Lab Work: NONE   If you have labs (blood work) drawn today and your tests are completely normal, you will receive your results only by: [facility] (if you have MyChart) OR A paper copy in the mail If you have any lab test that is abnormal or we need to change your treatment, we will call you to review the results.   Testing/Procedures: Your physician has requested that you have en exercise stress myoview. For further information please visit [email or web address]. Please follow instruction sheet, as given.  Your physician has requested that you have an echocardiogram. Echocardiography is a painless test that uses sound waves to create images of your heart. It provides your doctor with information about the size and shape of your heart and how well your heart's chambers and valves are working. This procedure takes approximately one hour. There are no restrictions for this procedure.    Follow-Up: At [facility], you and your health needs are our priority.  As part of our continuing mission to provide you with exceptional heart care, we have created designated Provider Care Teams.  These Care Teams include your primary Cardiologist (physician) and Advanced Practice Providers (APPs -  Physician Assistants and Nurse Practitioners) who all work together to provide you with the care you need, when you need it.  We recommend signing up for the patient portal called "MyChart".  Sign up information is provided on this After Visit Summary.  MyChart is used to connect with patients for Virtual Visits (Telemedicine).  Patients are able to view lab/test results, encounter notes, upcoming appointments, etc.  Non-urgent  messages can be sent to your provider as well.   To learn more about what you can do with MyChart, go to [email or web address].    Your next appointment:    As Needed   The format for your next appointment:   In Person  Provider:   Jenkins Rouge, MD   Other Instructions Thank you for choosing [facility]!

## 2021-06-10 ENCOUNTER — Ambulatory Visit (HOSPITAL_COMMUNITY): Payer: Medicare Other

## 2021-06-10 ENCOUNTER — Encounter (HOSPITAL_COMMUNITY): Payer: Medicare Other

## 2021-06-13 ENCOUNTER — Ambulatory Visit (HOSPITAL_COMMUNITY): Admission: RE | Admit: 2021-06-13 | Payer: Medicare Other | Source: Ambulatory Visit

## 2021-08-19 ENCOUNTER — Ambulatory Visit (HOSPITAL_COMMUNITY): Payer: Medicare Other

## 2021-08-19 ENCOUNTER — Encounter (HOSPITAL_COMMUNITY): Payer: Medicare Other

## 2021-08-19 ENCOUNTER — Ambulatory Visit (HOSPITAL_COMMUNITY): Admission: RE | Admit: 2021-08-19 | Payer: Medicare Other | Source: Ambulatory Visit

## 2021-11-08 DIAGNOSIS — M545 Low back pain, unspecified: Secondary | ICD-10-CM | POA: Diagnosis not present

## 2021-11-08 DIAGNOSIS — I10 Essential (primary) hypertension: Secondary | ICD-10-CM | POA: Diagnosis not present

## 2021-11-15 DIAGNOSIS — M545 Low back pain, unspecified: Secondary | ICD-10-CM | POA: Diagnosis not present

## 2021-11-15 DIAGNOSIS — Z1331 Encounter for screening for depression: Secondary | ICD-10-CM | POA: Diagnosis not present

## 2021-11-15 DIAGNOSIS — Z0001 Encounter for general adult medical examination with abnormal findings: Secondary | ICD-10-CM | POA: Diagnosis not present

## 2021-11-15 DIAGNOSIS — I10 Essential (primary) hypertension: Secondary | ICD-10-CM | POA: Diagnosis not present

## 2021-11-15 DIAGNOSIS — Z1389 Encounter for screening for other disorder: Secondary | ICD-10-CM | POA: Diagnosis not present

## 2021-11-19 ENCOUNTER — Ambulatory Visit (HOSPITAL_COMMUNITY)
Admission: RE | Admit: 2021-11-19 | Discharge: 2021-11-19 | Disposition: A | Discharge status: Home / Self Care | Payer: Medicare HMO | Source: Ambulatory Visit | Attending: Gerontology | Admitting: Gerontology

## 2021-11-19 ENCOUNTER — Other Ambulatory Visit: Payer: Self-pay

## 2021-11-19 ENCOUNTER — Other Ambulatory Visit (HOSPITAL_COMMUNITY): Payer: Self-pay | Admitting: Gerontology

## 2021-11-19 DIAGNOSIS — M545 Low back pain, unspecified: Secondary | ICD-10-CM

## 2021-11-19 DIAGNOSIS — M2578 Osteophyte, vertebrae: Secondary | ICD-10-CM | POA: Diagnosis not present

## 2021-12-06 DIAGNOSIS — I10 Essential (primary) hypertension: Secondary | ICD-10-CM | POA: Diagnosis not present

## 2021-12-06 DIAGNOSIS — M545 Low back pain, unspecified: Secondary | ICD-10-CM | POA: Diagnosis not present

## 2021-12-16 DIAGNOSIS — R0781 Pleurodynia: Secondary | ICD-10-CM | POA: Diagnosis not present

## 2021-12-16 DIAGNOSIS — M5134 Other intervertebral disc degeneration, thoracic region: Secondary | ICD-10-CM | POA: Diagnosis not present

## 2021-12-18 DIAGNOSIS — L821 Other seborrheic keratosis: Secondary | ICD-10-CM | POA: Diagnosis not present

## 2021-12-18 DIAGNOSIS — L820 Inflamed seborrheic keratosis: Secondary | ICD-10-CM | POA: Diagnosis not present

## 2021-12-18 DIAGNOSIS — D485 Neoplasm of uncertain behavior of skin: Secondary | ICD-10-CM | POA: Diagnosis not present

## 2021-12-18 DIAGNOSIS — L814 Other melanin hyperpigmentation: Secondary | ICD-10-CM | POA: Diagnosis not present

## 2022-02-15 ENCOUNTER — Encounter (HOSPITAL_COMMUNITY): Payer: Self-pay

## 2022-02-15 ENCOUNTER — Emergency Department (HOSPITAL_COMMUNITY)
Admission: EM | Admit: 2022-02-15 | Discharge: 2022-02-15 | Disposition: A | Discharge status: Home / Self Care | Payer: Medicare HMO | Attending: Emergency Medicine | Admitting: Emergency Medicine

## 2022-02-15 ENCOUNTER — Emergency Department (HOSPITAL_COMMUNITY): Payer: Medicare HMO

## 2022-02-15 ENCOUNTER — Other Ambulatory Visit: Payer: Self-pay

## 2022-02-15 DIAGNOSIS — S90221A Contusion of right lesser toe(s) with damage to nail, initial encounter: Secondary | ICD-10-CM

## 2022-02-15 DIAGNOSIS — S90211A Contusion of right great toe with damage to nail, initial encounter: Secondary | ICD-10-CM | POA: Diagnosis not present

## 2022-02-15 DIAGNOSIS — S99921A Unspecified injury of right foot, initial encounter: Secondary | ICD-10-CM | POA: Diagnosis present

## 2022-02-15 DIAGNOSIS — M79671 Pain in right foot: Secondary | ICD-10-CM | POA: Diagnosis not present

## 2022-02-15 DIAGNOSIS — W208XXA Other cause of strike by thrown, projected or falling object, initial encounter: Secondary | ICD-10-CM | POA: Diagnosis not present

## 2022-02-15 DIAGNOSIS — S90121A Contusion of right lesser toe(s) without damage to nail, initial encounter: Secondary | ICD-10-CM | POA: Diagnosis not present

## 2022-02-15 DIAGNOSIS — S90111A Contusion of right great toe without damage to nail, initial encounter: Secondary | ICD-10-CM | POA: Insufficient documentation

## 2022-02-15 MED ORDER — OXYCODONE-ACETAMINOPHEN 5-325 MG PO TABS: 1.0000 | ORAL_TABLET | Freq: Four times a day (QID) | ORAL | 0 refills | Status: DC | PRN

## 2022-02-15 MED ORDER — OXYCODONE-ACETAMINOPHEN 5-325 MG PO TABS
2.0000 | ORAL_TABLET | Freq: Once | ORAL | Status: AC
  Administered 2022-02-15: 2 via ORAL
  Filled 2022-02-15: qty 2

## 2022-02-15 NOTE — ED Provider Notes (Signed)
?[facility] ?Provider Note ? ? ?CSN: [id number] ?Arrival date & time: 02/15/22  0505 ? ?  ? ?History ? ?Chief Complaint  ?Patient presents with  ? Foot Pain  ? ? ?Carlos Vargas is a 71 y.o. male. ? ?Dropped something heavy on right big toe on Wednesday. Pain since then, but thought it'd be fine by this morning to go to work. It's not. Came here. Tried excedrin with minimal relief. No other injuries.  ? ? ?Foot Pain ? ? ?  ? ?Home Medications ?Prior to Admission medications   ?Medication Sig Start Date End Date Taking? Authorizing Provider  ?oxyCODONE-acetaminophen (PERCOCET/ROXICET) 5-325 MG tablet Take 1 tablet by mouth every 6 (six) hours as needed for severe pain. 02/15/22  Yes , Corene Cornea, MD  ?albuterol (VENTOLIN HFA) 108 (90 Base) MCG/ACT inhaler Inhale 2 puffs into the lungs every 4 (four) hours as needed for wheezing or shortness of breath. 04/09/21   Lajean Saver, MD  ?   ? ?Allergies    ?Patient has no known allergies.   ? ?Review of Systems   ?Review of Systems ? ?Physical Exam ?Updated Vital Signs ?BP 136/73   Pulse 76   Temp 98 ?F (36.7 ?C) (Oral)   Resp 16   Ht '6\' 1"'$  (1.854 m)   Wt 82.1 kg   SpO2 100%   BMI 23.88 kg/m?  ?Physical Exam ?Vitals and nursing note reviewed.  ?Constitutional:   ?   Appearance: He is well-developed.  ?HENT:  ?   Head: Normocephalic and atraumatic.  ?   Mouth/Throat:  ?   Mouth: Mucous membranes are moist.  ?   Pharynx: Oropharynx is clear.  ?Eyes:  ?   Pupils: Pupils are equal, round, and reactive to light.  ?Cardiovascular:  ?   Rate and Rhythm: Normal rate.  ?Pulmonary:  ?   Effort: Pulmonary effort is normal. No respiratory distress.  ?Abdominal:  ?   General: Abdomen is flat. There is no distension.  ?Musculoskeletal:     ?   General: Tenderness (right big toe) and signs of injury present. Normal range of motion.  ?   Cervical back: Normal range of motion.  ?Skin: ?   General: Skin is warm and dry.  ?   Coloration: Skin is not jaundiced or pale.   ?Neurological:  ?   General: No focal deficit present.  ?   Mental Status: He is alert.  ? ? ?ED Results / Procedures / Treatments   ?Labs ?(all labs ordered are listed, but only abnormal results are displayed) ?Labs Reviewed - No data to display ? ?EKG ?None ? ?Radiology ?DG Foot Complete Right ? ?Result Date: 02/15/2022 ?CLINICAL DATA:  71 year old male with history of right-sided foot pain for the past several days. EXAM: RIGHT FOOT COMPLETE - 3+ VIEW COMPARISON:  None. FINDINGS: There is no evidence of fracture or dislocation. There is no evidence of arthropathy or other focal bone abnormality. Dystrophic ossification noted medial to the first interphalangeal joint, presumably secondary to remote trauma. Soft tissues are unremarkable. IMPRESSION: 1. No acute radiographic abnormality of the right foot. Electronically Signed   By: Vinnie Langton M.D.   On: 02/15/2022 06:01   ? ?Procedures ?Procedures  ? ? ?Medications Ordered in ED ?Medications  ?oxyCODONE-acetaminophen (PERCOCET/ROXICET) 5-325 MG per tablet 2 tablet (2 tablets Oral Given 02/15/22 0555)  ? ? ?ED Course/ Medical Decision Making/ A&P ?  ?                        ?  Medical Decision Making ?Amount and/or Complexity of Data Reviewed ?Radiology: ordered. ? ?Risk ?Prescription drug management. ? ? ?Toe not fracture. Pain likely related to subungual hematoma. Will write out of work. Trephination not really an option 4 days after injury. Anticipatory guidance and work note provided.  ? ?Final Clinical Impression(s) / ED Diagnoses ?Final diagnoses:  ?Contusion of toenail of right foot, initial encounter  ? ? ?Rx / DC Orders ?ED Discharge Orders   ? ?      Ordered  ?  oxyCODONE-acetaminophen (PERCOCET/ROXICET) 5-325 MG tablet  Every 6 hours PRN       ? 02/15/22 8016  ? ?  ?  ? ?  ? ? ?  ?Merrily Pew, MD ?02/15/22 [phone] ? ?

## 2022-02-15 NOTE — ED Notes (Signed)
ED Provider at bedside. 

## 2022-02-15 NOTE — ED Triage Notes (Signed)
Pt arrived via POV c/o right foot pain since Wednesday. Pt does not remember what exactly fell on his foot, but reports it was "something heavy". Minor swelling noted to great toe on right foot. No bruising present. Pt reports trying to take Excedrin at home for discomfort, w/o relief. ?

## 2022-02-17 MED FILL — Oxycodone w/ Acetaminophen Tab 5-325 MG: ORAL | Qty: 6 | Status: AC

## 2022-02-21 DIAGNOSIS — M9903 Segmental and somatic dysfunction of lumbar region: Secondary | ICD-10-CM | POA: Diagnosis not present

## 2022-02-21 DIAGNOSIS — M546 Pain in thoracic spine: Secondary | ICD-10-CM | POA: Diagnosis not present

## 2022-02-21 DIAGNOSIS — M9902 Segmental and somatic dysfunction of thoracic region: Secondary | ICD-10-CM | POA: Diagnosis not present

## 2022-02-21 DIAGNOSIS — M9901 Segmental and somatic dysfunction of cervical region: Secondary | ICD-10-CM | POA: Diagnosis not present

## 2022-02-21 DIAGNOSIS — M542 Cervicalgia: Secondary | ICD-10-CM | POA: Diagnosis not present

## 2022-02-21 DIAGNOSIS — M62830 Muscle spasm of back: Secondary | ICD-10-CM | POA: Diagnosis not present

## 2022-02-26 DIAGNOSIS — M9902 Segmental and somatic dysfunction of thoracic region: Secondary | ICD-10-CM | POA: Diagnosis not present

## 2022-02-26 DIAGNOSIS — M546 Pain in thoracic spine: Secondary | ICD-10-CM | POA: Diagnosis not present

## 2022-02-26 DIAGNOSIS — M62830 Muscle spasm of back: Secondary | ICD-10-CM | POA: Diagnosis not present

## 2022-02-26 DIAGNOSIS — M9901 Segmental and somatic dysfunction of cervical region: Secondary | ICD-10-CM | POA: Diagnosis not present

## 2022-02-26 DIAGNOSIS — M542 Cervicalgia: Secondary | ICD-10-CM | POA: Diagnosis not present

## 2022-02-26 DIAGNOSIS — M9903 Segmental and somatic dysfunction of lumbar region: Secondary | ICD-10-CM | POA: Diagnosis not present

## 2022-02-28 DIAGNOSIS — M9902 Segmental and somatic dysfunction of thoracic region: Secondary | ICD-10-CM | POA: Diagnosis not present

## 2022-02-28 DIAGNOSIS — M546 Pain in thoracic spine: Secondary | ICD-10-CM | POA: Diagnosis not present

## 2022-02-28 DIAGNOSIS — M542 Cervicalgia: Secondary | ICD-10-CM | POA: Diagnosis not present

## 2022-02-28 DIAGNOSIS — M9901 Segmental and somatic dysfunction of cervical region: Secondary | ICD-10-CM | POA: Diagnosis not present

## 2022-02-28 DIAGNOSIS — M9903 Segmental and somatic dysfunction of lumbar region: Secondary | ICD-10-CM | POA: Diagnosis not present

## 2022-02-28 DIAGNOSIS — M62830 Muscle spasm of back: Secondary | ICD-10-CM | POA: Diagnosis not present

## 2022-03-05 DIAGNOSIS — M62830 Muscle spasm of back: Secondary | ICD-10-CM | POA: Diagnosis not present

## 2022-03-05 DIAGNOSIS — M542 Cervicalgia: Secondary | ICD-10-CM | POA: Diagnosis not present

## 2022-03-05 DIAGNOSIS — M546 Pain in thoracic spine: Secondary | ICD-10-CM | POA: Diagnosis not present

## 2022-03-05 DIAGNOSIS — M9902 Segmental and somatic dysfunction of thoracic region: Secondary | ICD-10-CM | POA: Diagnosis not present

## 2022-03-05 DIAGNOSIS — M9903 Segmental and somatic dysfunction of lumbar region: Secondary | ICD-10-CM | POA: Diagnosis not present

## 2022-03-05 DIAGNOSIS — M9901 Segmental and somatic dysfunction of cervical region: Secondary | ICD-10-CM | POA: Diagnosis not present

## 2022-03-07 DIAGNOSIS — M9903 Segmental and somatic dysfunction of lumbar region: Secondary | ICD-10-CM | POA: Diagnosis not present

## 2022-03-07 DIAGNOSIS — M546 Pain in thoracic spine: Secondary | ICD-10-CM | POA: Diagnosis not present

## 2022-03-07 DIAGNOSIS — M62830 Muscle spasm of back: Secondary | ICD-10-CM | POA: Diagnosis not present

## 2022-03-07 DIAGNOSIS — M9902 Segmental and somatic dysfunction of thoracic region: Secondary | ICD-10-CM | POA: Diagnosis not present

## 2022-03-07 DIAGNOSIS — M9901 Segmental and somatic dysfunction of cervical region: Secondary | ICD-10-CM | POA: Diagnosis not present

## 2022-03-07 DIAGNOSIS — M542 Cervicalgia: Secondary | ICD-10-CM | POA: Diagnosis not present

## 2022-03-12 DIAGNOSIS — M9902 Segmental and somatic dysfunction of thoracic region: Secondary | ICD-10-CM | POA: Diagnosis not present

## 2022-03-12 DIAGNOSIS — M62830 Muscle spasm of back: Secondary | ICD-10-CM | POA: Diagnosis not present

## 2022-03-12 DIAGNOSIS — M542 Cervicalgia: Secondary | ICD-10-CM | POA: Diagnosis not present

## 2022-03-12 DIAGNOSIS — M9901 Segmental and somatic dysfunction of cervical region: Secondary | ICD-10-CM | POA: Diagnosis not present

## 2022-03-12 DIAGNOSIS — M9903 Segmental and somatic dysfunction of lumbar region: Secondary | ICD-10-CM | POA: Diagnosis not present

## 2022-03-12 DIAGNOSIS — M546 Pain in thoracic spine: Secondary | ICD-10-CM | POA: Diagnosis not present

## 2022-03-14 DIAGNOSIS — M546 Pain in thoracic spine: Secondary | ICD-10-CM | POA: Diagnosis not present

## 2022-03-14 DIAGNOSIS — M9902 Segmental and somatic dysfunction of thoracic region: Secondary | ICD-10-CM | POA: Diagnosis not present

## 2022-03-14 DIAGNOSIS — M542 Cervicalgia: Secondary | ICD-10-CM | POA: Diagnosis not present

## 2022-03-14 DIAGNOSIS — M9903 Segmental and somatic dysfunction of lumbar region: Secondary | ICD-10-CM | POA: Diagnosis not present

## 2022-03-14 DIAGNOSIS — M9901 Segmental and somatic dysfunction of cervical region: Secondary | ICD-10-CM | POA: Diagnosis not present

## 2022-03-14 DIAGNOSIS — M62830 Muscle spasm of back: Secondary | ICD-10-CM | POA: Diagnosis not present

## 2022-05-15 DIAGNOSIS — I10 Essential (primary) hypertension: Secondary | ICD-10-CM | POA: Diagnosis not present

## 2022-05-15 DIAGNOSIS — M542 Cervicalgia: Secondary | ICD-10-CM | POA: Diagnosis not present

## 2022-08-27 IMAGING — DX DG FOOT COMPLETE 3+V*R*
3 series · 3 of 3 positions shown · non-contrast
Comparison: None.

CLINICAL DATA: 70-year-old male with history of right-sided foot
pain for the past several days.

EXAM:
RIGHT FOOT COMPLETE - 3+ VIEW

[foot ap]
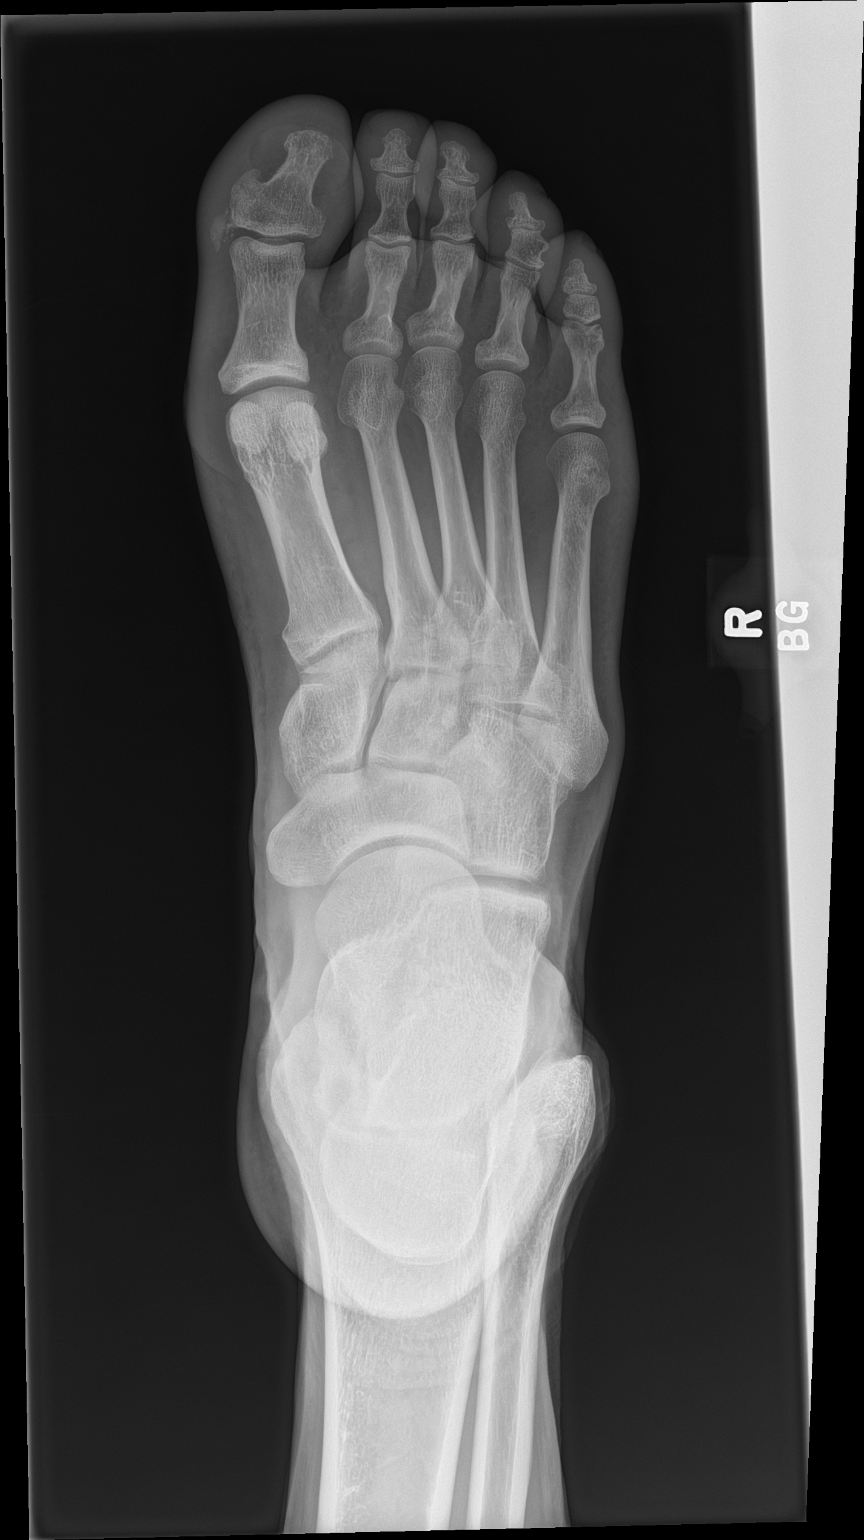

[foot obl]
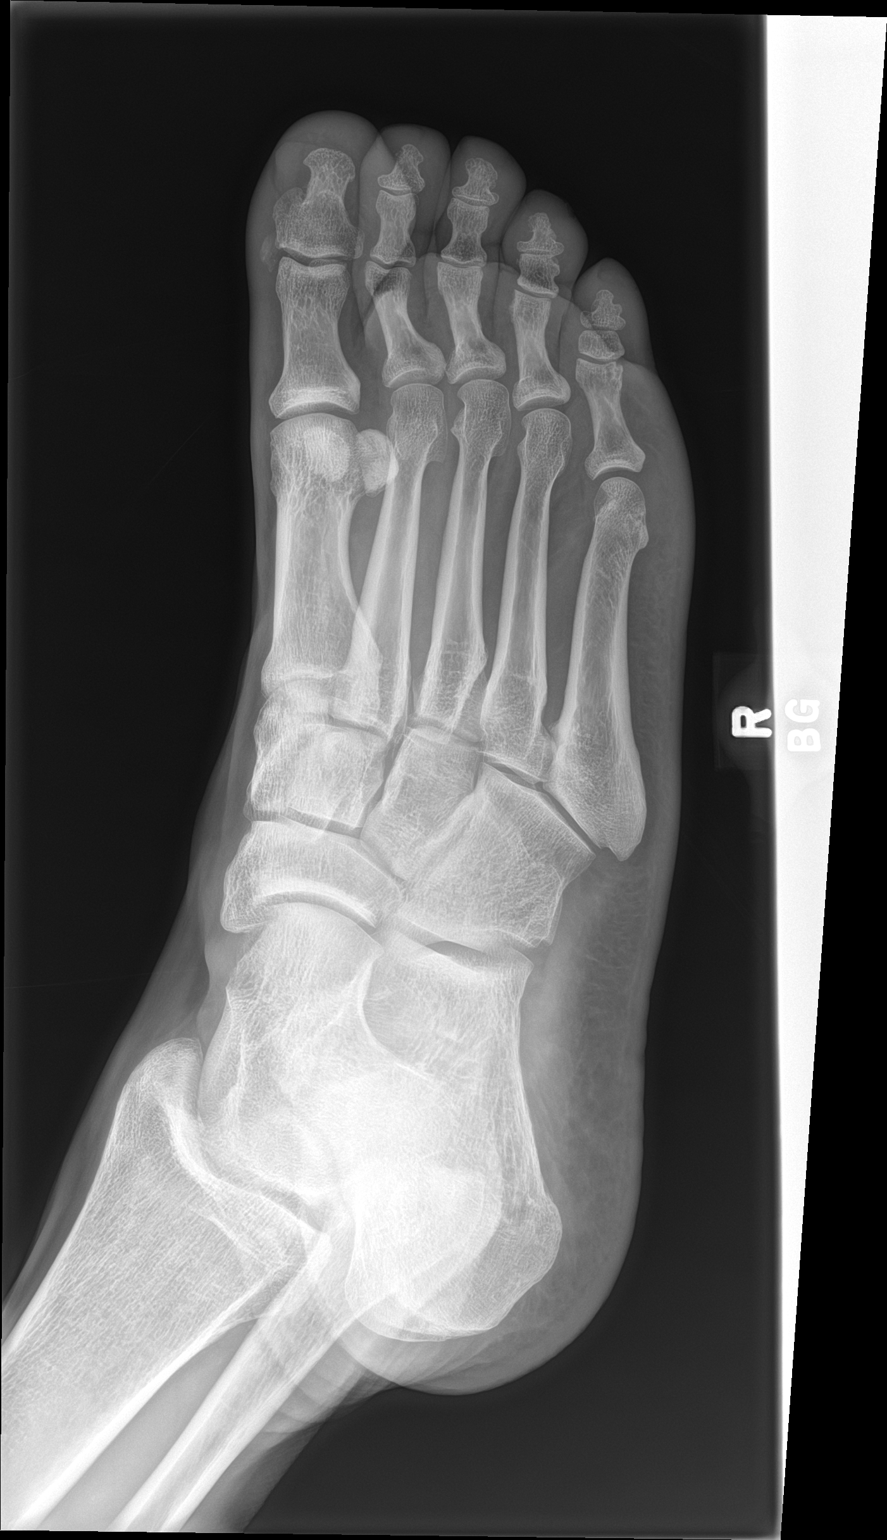

[foot lat]
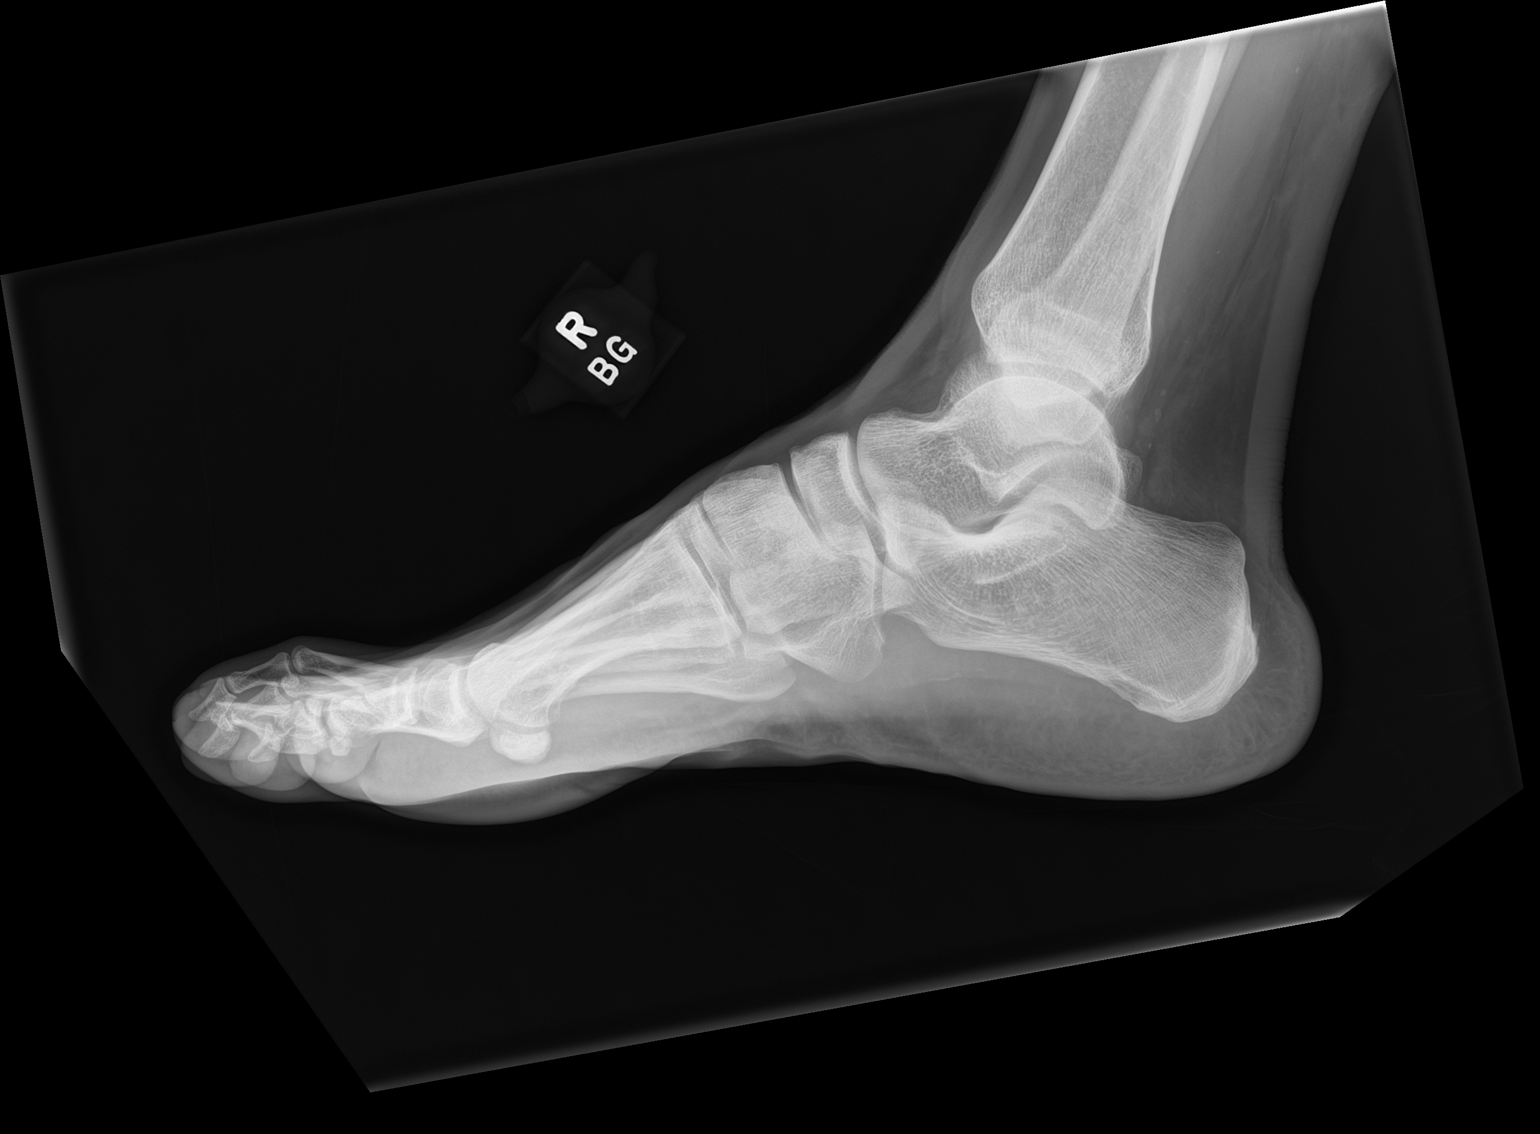

[3 of 3 positions shown; findings below may reference images not displayed]

FINDINGS: There is no evidence of fracture or dislocation. There is no
evidence of arthropathy or other focal bone abnormality. Dystrophic
ossification noted medial to the first interphalangeal joint,
presumably secondary to remote trauma. Soft tissues are
unremarkable.
IMPRESSION: 1. No acute radiographic abnormality of the right foot.

## 2022-12-31 DIAGNOSIS — M130 Polyarthritis, unspecified: Secondary | ICD-10-CM | POA: Diagnosis not present

## 2022-12-31 DIAGNOSIS — I10 Essential (primary) hypertension: Secondary | ICD-10-CM | POA: Diagnosis not present

## 2023-01-14 DIAGNOSIS — M109 Gout, unspecified: Secondary | ICD-10-CM | POA: Diagnosis not present

## 2023-01-14 DIAGNOSIS — I10 Essential (primary) hypertension: Secondary | ICD-10-CM | POA: Diagnosis not present

## 2023-01-14 DIAGNOSIS — R739 Hyperglycemia, unspecified: Secondary | ICD-10-CM | POA: Diagnosis not present

## 2023-01-14 DIAGNOSIS — Z0001 Encounter for general adult medical examination with abnormal findings: Secondary | ICD-10-CM | POA: Diagnosis not present

## 2023-01-14 DIAGNOSIS — Z125 Encounter for screening for malignant neoplasm of prostate: Secondary | ICD-10-CM | POA: Diagnosis not present

## 2023-01-21 DIAGNOSIS — Z1389 Encounter for screening for other disorder: Secondary | ICD-10-CM | POA: Diagnosis not present

## 2023-01-21 DIAGNOSIS — Z0001 Encounter for general adult medical examination with abnormal findings: Secondary | ICD-10-CM | POA: Diagnosis not present

## 2023-01-21 DIAGNOSIS — I10 Essential (primary) hypertension: Secondary | ICD-10-CM | POA: Diagnosis not present

## 2023-01-21 DIAGNOSIS — Z1331 Encounter for screening for depression: Secondary | ICD-10-CM | POA: Diagnosis not present

## 2023-01-21 DIAGNOSIS — M130 Polyarthritis, unspecified: Secondary | ICD-10-CM | POA: Diagnosis not present

## 2023-01-21 DIAGNOSIS — R7303 Prediabetes: Secondary | ICD-10-CM | POA: Diagnosis not present

## 2023-01-21 DIAGNOSIS — E785 Hyperlipidemia, unspecified: Secondary | ICD-10-CM | POA: Diagnosis not present

## 2023-01-23 ENCOUNTER — Encounter: Payer: Self-pay | Admitting: *Deleted

## 2023-02-03 DIAGNOSIS — M545 Low back pain, unspecified: Secondary | ICD-10-CM | POA: Diagnosis not present

## 2023-02-03 DIAGNOSIS — M62830 Muscle spasm of back: Secondary | ICD-10-CM | POA: Diagnosis not present

## 2023-02-03 DIAGNOSIS — J029 Acute pharyngitis, unspecified: Secondary | ICD-10-CM | POA: Diagnosis not present

## 2023-02-03 DIAGNOSIS — I10 Essential (primary) hypertension: Secondary | ICD-10-CM | POA: Diagnosis not present

## 2023-02-04 ENCOUNTER — Other Ambulatory Visit (HOSPITAL_COMMUNITY): Payer: Self-pay | Admitting: Gerontology

## 2023-02-04 ENCOUNTER — Ambulatory Visit (HOSPITAL_COMMUNITY)
Admission: RE | Admit: 2023-02-04 | Discharge: 2023-02-04 | Disposition: A | Discharge status: Home / Self Care | Payer: Medicare HMO | Source: Ambulatory Visit | Attending: Gerontology | Admitting: Gerontology

## 2023-02-04 DIAGNOSIS — R079 Chest pain, unspecified: Secondary | ICD-10-CM | POA: Diagnosis not present

## 2023-02-04 DIAGNOSIS — M545 Low back pain, unspecified: Secondary | ICD-10-CM

## 2023-02-04 DIAGNOSIS — R059 Cough, unspecified: Secondary | ICD-10-CM | POA: Diagnosis not present

## 2023-02-09 ENCOUNTER — Telehealth: Payer: Self-pay | Admitting: *Deleted

## 2023-02-09 NOTE — Telephone Encounter (Signed)
  Procedure: colonoscopy  Height: 5'11 Weight: 178lbs        Have you had a colonoscopy before?  Yes, ? when  Do you have family history of colon cancer?  Yes grandfather  Do you have a family history of polyps? no  Previous colonoscopy with polyps removed? N/a  Do you have a history colorectal cancer?   no  Are you diabetic?  no  Do you have a prosthetic or mechanical heart valve? no  Do you have a pacemaker/defibrillator?   no  Have you had endocarditis/atrial fibrillation?  no  Do you use supplemental oxygen/CPAP?  no  Have you had joint replacement within the last 12 months?  no  Do you tend to be constipated or have to use laxatives?  no   Do you have history of alcohol use? If yes, how much and how often.  no  Do you have history or are you using drugs? If yes, what do are you  using?  no  Have you ever had a stroke/heart attack?    Have you ever had a heart or other vascular stent placed,?  Do you take weight loss medication? no  Do you take any blood-thinning medications such as: (Plavix, aspirin, Coumadin, Aggrenox, Brilinta, Xarelto, Eliquis, Pradaxa, Savaysa or Effient)? no  If yes we need the name, milligram, dosage and who is prescribing doctor:               No medications listed   No Known Allergies

## 2023-02-17 ENCOUNTER — Encounter: Payer: Self-pay | Admitting: Orthopedic Surgery

## 2023-02-17 ENCOUNTER — Ambulatory Visit (INDEPENDENT_AMBULATORY_CARE_PROVIDER_SITE_OTHER): Payer: Medicare HMO | Admitting: Orthopedic Surgery

## 2023-02-17 VITALS — BP 142/80 | HR 60 | Ht 73.0 in | Wt 187.0 lb

## 2023-02-17 DIAGNOSIS — M47816 Spondylosis without myelopathy or radiculopathy, lumbar region: Secondary | ICD-10-CM | POA: Diagnosis not present

## 2023-02-17 MED ORDER — CYCLOBENZAPRINE HCL 10 MG PO TABS: 10.0000 mg | ORAL_TABLET | Freq: Two times a day (BID) | ORAL | 0 refills | Status: DC | PRN

## 2023-02-17 NOTE — Progress Notes (Signed)
New Patient Visit  Assessment: Carlos Vargas is a 72 y.o. male with the following: 1. Arthritis of lumbar spine   Plan: Carlos Vargas has pain and stiffness in his lower back, with some radiating pains into his left leg.  No concerning signs on physical exam.  He has good strength, and no numbness or tingling.  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  This was discussed with the patient in clinic today.  It has been going on for over 18 months.  He remains active, and would like to continue with more intense labor.  We discussed multiple treatment options, and she is interested in trying some Flexeril.  He would also like to speak with a spine specialist for further evaluation, including additional options.  As such, we will refer him to see Dr. Willia Craze in [place].  If he has any further issues, he will contact our clinic.  Follow-up: Return for Referral to Dr. Christell Constant.  Subjective:  Chief Complaint  Patient presents with   Back Pain    LBP with pain going down the left leg for the past 5-6 mos.     History of Present Illness: Carlos Vargas is a 72 y.o. male who has been referred by  Avon Gully, MD for evaluation of low back pain.  He states he has been having pain in his lower back since the fall 2022.  At that time, he noted that he was unable to work for couple of months.  Since then, his back pain has progressed.  He notes stiffness, especially in the morning.  Over the past 6 months, he notes some pain which is radiating into his left leg.  He is no longer able to touch his toes.  He denies a specific injury.  Recent radiographs demonstrate some arthritis, and he states this is the first time he has heard of arthritis in his back.  Currently, he is unable to complete the duties at work that he prefers.  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  He denies numbness and  tingling in his legs.  According to family in the clinic today, he has been diagnosed with rheumatoid arthritis, but is not taking medications for this condition.   Review of Systems: No fevers or chills No numbness or tingling No chest pain No shortness of breath No bowel or bladder dysfunction No GI distress No headaches   Medical History:  Dyslipidemia Hypertension  Past Surgical History:  Procedure Laterality Date   CHEST SURGERY  1969   shrapnel from [place] war    Family History  Problem Relation Age of Onset   Diabetes Mother    Hypertension Father    Diabetes Sister    COPD Brother    Alzheimer's disease Maternal Grandmother    Cancer Paternal Grandfather    Social History   Tobacco Use   Smoking status: Former    Packs/day: .33    Types: Cigarettes   Smokeless tobacco: Never  Vaping Use   Vaping Use: Never used  Substance Use Topics   Alcohol use: No   Drug use: No    No Known Allergies  Current Meds  Medication Sig   amLODipine (NORVASC) 2.5 MG tablet Take 2.5 mg by mouth every morning.   atorvastatin (LIPITOR) 20 MG tablet Take 20 mg by mouth at bedtime.   cyclobenzaprine (FLEXERIL) 10 MG tablet Take 1 tablet (10  mg total) by mouth 2 (two) times daily as needed.   ibuprofen (ADVIL) 800 MG tablet Take 800 mg by mouth every 8 (eight) hours as needed.    Objective: BP (!) 142/80   Pulse 60   Ht  (1.854 m)   Wt 187 lb (84.8 kg)   BMI 24.67 kg/m   Physical Exam:  General: Alert and oriented. and No acute distress. Gait: Normal gait.  Lower back without deformity.  No tenderness to palpation along lower back.  He does demonstrate stiffness when trying to touch his toes.  Negative straight leg raise bilaterally.  He has 5/5 strength in bilateral lower extremities.  Sensation is intact throughout both lower extremities.  1+ patellar tendon reflexes bilaterally.  IMAGING: I personally reviewed images previously obtained in  clinic  X-rays of the lumbar spine demonstrates diffuse degenerative changes in the lower spine.  Anterior bridging osteophytes.  No anterolisthesis   New Medications:  Meds ordered this encounter  Medications   cyclobenzaprine (FLEXERIL) 10 MG tablet    Sig: Take 1 tablet (10 mg total) by mouth 2 (two) times daily as needed.    Dispense:  20 tablet    Refill:  0      Oliver Barre, MD  02/17/2023 8:57 AM

## 2023-02-24 NOTE — Telephone Encounter (Signed)
Pt is not sure.

## 2023-02-24 NOTE — Telephone Encounter (Signed)
Looks like he has an upcoming appt with GI at [facility]. Does he want to stay with this practice or go there?

## 2023-02-24 NOTE — Telephone Encounter (Signed)
When was his last colonoscopy? 

## 2023-02-25 NOTE — Telephone Encounter (Signed)
Spoke with pt. He stated he will just go to [facility] since he is already scheduled there.   Tammy, take his triage out please. Thanks!

## 2023-02-25 NOTE — Telephone Encounter (Signed)
Noted.Will do

## 2023-02-26 ENCOUNTER — Encounter: Payer: Self-pay | Admitting: Orthopedic Surgery

## 2023-02-26 ENCOUNTER — Ambulatory Visit (INDEPENDENT_AMBULATORY_CARE_PROVIDER_SITE_OTHER): Payer: Medicare HMO | Admitting: Orthopedic Surgery

## 2023-02-26 ENCOUNTER — Other Ambulatory Visit (INDEPENDENT_AMBULATORY_CARE_PROVIDER_SITE_OTHER): Payer: Medicare HMO

## 2023-02-26 VITALS — BP 154/83 | HR 65 | Ht 73.0 in | Wt 187.0 lb

## 2023-02-26 DIAGNOSIS — M47816 Spondylosis without myelopathy or radiculopathy, lumbar region: Secondary | ICD-10-CM

## 2023-02-26 DIAGNOSIS — M5416 Radiculopathy, lumbar region: Secondary | ICD-10-CM | POA: Diagnosis not present

## 2023-02-26 MED ORDER — PREGABALIN 75 MG PO CAPS: 75.0000 mg | ORAL_CAPSULE | Freq: Two times a day (BID) | ORAL | 0 refills | Status: DC

## 2023-02-26 NOTE — Progress Notes (Signed)
Orthopedic Spine Surgery Office Note  Assessment: Patient is a 72 y.o. male with low back pain that radiates into the left buttock and posterior proximal thigh.  Possible radiculopathy   Plan: -Explained that initially conservative treatment is tried as a significant number of patients may experience relief with these treatment modalities. Discussed that the conservative treatments include:  -activity modification  -physical therapy  -over the counter pain medications  -medrol dosepak  -lumbar steroid injections -Patient has tried Tylenol, muscle relaxers -Patient has tried conservative treatment including Tylenol and muscle relaxers for over 6 weeks without any relief, so recommended MRI of the lumbar spine to evaluate for radiculopathy -Prescribed lyrica to try for additional pain relief -Patient should return to office in 5 weeks, x-rays at next visit: none   Patient expressed understanding of the plan and all questions were answered to the patient's satisfaction.   ___________________________________________________________________________   History:  Patient is a 72 y.o. male who presents today for lumbar spine.  Patient has had low back pain that radiates into the left hip for the last year and a half.  He first noticed it in November 2022.  There was no trauma or injury that brought on the pain.  He states his pain was severe around that time in November 2022 and he could not do anything.  He states he was limping and was not able to work for 2 months.  His pain did improve somewhat with time, but he is noticed within the last couple of months return of the severe pain.  He has tried Tylenol and muscle relaxers over the course of this 1 and half years.  He does get some temporary relief with those treatments but no lasting relief.  He has no pain radiating past the proximal aspect of the left thigh.  Pain is felt in the low back radiating into the left buttock and left posterior  proximal thigh.  No pain on the right lower extremity or buttock.   Weakness: Yes, reports low back weakness.  No other weakness reported Symptoms of imbalance: Denies Paresthesias and numbness: Denies Bowel or bladder incontinence: Denies Saddle anesthesia: Denies  Treatments tried: Tylenol, muscle relaxers  Review of systems: Denies fevers and chills, night sweats, unexplained weight loss, history of cancer. Has had pain that wakes him at night  Past medical history: Hyperlipidemia Hypertension History of stroke Rheumatoid arthritis Chronic pain  Allergies: NKDA  Past surgical history:  None  Social history: Denies use of nicotine product (smoking, vaping, patches, smokeless) Alcohol use: Denies Denies recreational drug use   Physical Exam:  BMI of 24.7  General: no acute distress, appears stated age Neurologic: alert, answering questions appropriately, following commands Respiratory: unlabored breathing on room air, symmetric chest rise Psychiatric: appropriate affect, normal cadence to speech   MSK (spine):  -Strength exam      Left  Right EHL    5/5  5/5 TA    5/5  5/5 GSC    5/5  5/5 Knee extension  5/5  5/5 Hip flexion   5/5  5/5  -Sensory exam    Sensation intact to light touch in L3-S1 nerve distributions of bilateral lower extremities  -Achilles DTR: 2/4 on the left, 2/4 on the right -Patellar tendon DTR: 2/4 on the left, 2/4 on the right  -Straight leg raise: Negative bilaterally -Femoral nerve stretch test: Negative bilaterally -Clonus: no beats bilaterally  -Left hip exam: No pain through range of motion, negative Stinchfield, negative FABER -Right  hip exam: No pain through range of motion, negative Stinchfield, negative FABER  Imaging: XR of the lumbar spine from 02/04/2023 and 02/26/2023 was independently reviewed and interpreted, showing disc height loss at L3/4, L4/5, and L5/S1. Anterior bridging syndesmophytes are seen at L3/4,  L5/S1.  No fracture or dislocation seen. No evidence of instability on flexion/extension views.    Patient name: Carlos Vargas Patient MRN: [id number] Date of visit: 02/26/23

## 2023-03-05 DIAGNOSIS — E785 Hyperlipidemia, unspecified: Secondary | ICD-10-CM | POA: Diagnosis not present

## 2023-03-05 DIAGNOSIS — I10 Essential (primary) hypertension: Secondary | ICD-10-CM | POA: Diagnosis not present

## 2023-03-17 ENCOUNTER — Ambulatory Visit: Payer: Medicare HMO | Admitting: Family Medicine

## 2023-03-19 ENCOUNTER — Telehealth: Payer: Self-pay | Admitting: Orthopedic Surgery

## 2023-03-19 MED ORDER — METHYLPREDNISOLONE 4 MG PO TBPK: ORAL_TABLET | ORAL | 0 refills | Status: DC

## 2023-03-19 MED ORDER — PREGABALIN 75 MG PO CAPS: 75.0000 mg | ORAL_CAPSULE | Freq: Two times a day (BID) | ORAL | 0 refills | Status: DC

## 2023-03-19 NOTE — Telephone Encounter (Signed)
Received call from Iona Beard patient's wife advised patient need Rx refilled Lyrica. Katrina also asked if patient could get a Rx for Prednisone?   The number to contact patient is [phone]

## 2023-03-25 ENCOUNTER — Telehealth: Payer: Self-pay | Admitting: Orthopedic Surgery

## 2023-03-25 NOTE — Telephone Encounter (Signed)
Lmom for pt to call us back to discuss

## 2023-03-25 NOTE — Telephone Encounter (Signed)
Pt's granddaughter Corrie Dandy called requesting refill on pain medication. She states pt will be out of medication before his MRI appt and Dr appt with Dr Christell Constant. Please send medication to [facility] in [place]. Pharmacy on file. Pt phone number is [phone]

## 2023-03-31 NOTE — Telephone Encounter (Signed)
I called and LMOM #2 for pt, to call us back------Patient has just started with the Gabapentin on 03/19/2023

## 2023-04-02 NOTE — Telephone Encounter (Signed)
Patient has not called back.

## 2023-04-05 DIAGNOSIS — I10 Essential (primary) hypertension: Secondary | ICD-10-CM | POA: Diagnosis not present

## 2023-04-05 DIAGNOSIS — E785 Hyperlipidemia, unspecified: Secondary | ICD-10-CM | POA: Diagnosis not present

## 2023-04-06 ENCOUNTER — Telehealth: Payer: Self-pay | Admitting: Orthopedic Surgery

## 2023-04-06 NOTE — Telephone Encounter (Signed)
Patient's wife Carlos Vargas returned call asked for a call back.  She would not give any more information. She advised someone just called and wanted them to return call at [phone].

## 2023-04-08 ENCOUNTER — Ambulatory Visit: Payer: Medicare HMO | Admitting: Family Medicine

## 2023-04-08 ENCOUNTER — Ambulatory Visit (HOSPITAL_COMMUNITY)
Admission: RE | Admit: 2023-04-08 | Discharge: 2023-04-08 | Disposition: A | Discharge status: Home / Self Care | Payer: Medicare HMO | Source: Ambulatory Visit | Attending: Orthopedic Surgery | Admitting: Orthopedic Surgery

## 2023-04-08 DIAGNOSIS — M5416 Radiculopathy, lumbar region: Secondary | ICD-10-CM | POA: Diagnosis not present

## 2023-04-08 NOTE — Telephone Encounter (Signed)
Tried to call again, VM is full, he is scheduled for 04/15/23 to see Dr Christell Constant

## 2023-04-08 NOTE — Telephone Encounter (Signed)
Carlos Vargas is not on the chart for [place] to be able to communicate with. I have tried calling pt with no response. I will try again later.

## 2023-04-13 NOTE — Telephone Encounter (Signed)
We will discuss with him when he comes in on 04/15/23

## 2023-04-15 ENCOUNTER — Ambulatory Visit (INDEPENDENT_AMBULATORY_CARE_PROVIDER_SITE_OTHER): Payer: Medicare HMO | Admitting: Orthopedic Surgery

## 2023-04-15 DIAGNOSIS — M5416 Radiculopathy, lumbar region: Secondary | ICD-10-CM | POA: Diagnosis not present

## 2023-04-15 MED ORDER — METHYLPREDNISOLONE 4 MG PO TBPK
ORAL_TABLET | ORAL | 0 refills | Status: DC
Start: 2023-04-15 — End: 2023-10-21

## 2023-04-16 NOTE — Progress Notes (Signed)
Orthopedic Spine Surgery Office Note   Assessment: Patient is a 72 y.o. male with low back pain that radiates into the bilateral buttock and left posterior proximal thigh. 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 Referral provided to him today -Prescribed a medrol dosepak -Patient should return to office in 6 weeks, x-rays at next visit: none     Patient expressed understanding of the plan and all questions were answered to the patient's satisfaction.    ___________________________________________________________________________     History:   Patient is a 72 y.o. male who presents today for follow up on his lumbar spine. He is having low back pain that radiates into his left buttock and posterior thigh. He is now having some pain in his right buttock but it is not as severe as the left side. His pain is similar in terms of intensity since the last time I saw him. Pain is felt on a daily basis. He feels the pain regardless of how active he is. He first developed this pain in November 2022.      Treatments tried: Tylenol, muscle relaxers, lyrica    Physical Exam:   General: no acute distress, appears stated age Neurologic: alert, answering questions appropriately, following commands Respiratory: unlabored breathing on room air, symmetric chest rise Psychiatric: appropriate affect, normal cadence to speech     MSK (spine):   -Strength exam                                                   Left                  Right EHL                              5/5                  5/5 TA                                 5/5                  5/5 GSC                             5/5                  5/5 Knee extension            5/5                  5/5 Hip flexion                    5/5                  5/5   -Sensory exam                           Sensation intact to light touch in L3-S1  nerve distributions of bilateral lower extremities   -Achilles DTR: 2/4 on the left, 2/4 on the right -Patellar tendon DTR: 2/4  on the left, 2/4 on the right   -Straight leg raise: Negative bilaterally -Femoral nerve stretch test: Negative bilaterally -Clonus: no beats bilaterally   -Left hip exam: No pain through range of motion, negative Stinchfield, negative FABER -Right hip exam: No pain through range of motion, negative Stinchfield, negative FABER   Imaging: XR of the lumbar spine from 02/04/2023 and 02/26/2023 was previously independently reviewed and interpreted, showing disc height loss at L3/4, L4/5, and L5/S1. Anterior bridging syndesmophytes are seen at L3/4, L5/S1.  No fracture or dislocation seen. No evidence of instability on flexion/extension views.    MRI of the lumbar from 04/08/2023 was independently reviewed and interpreted, showing L4/5 central and lateral recess stenosis. Has bilateral foraminal stenosis at L5/S1,    Patient name: Carlos Vargas Patient MRN: [id number] Date of visit: 04/16/23

## 2023-04-27 ENCOUNTER — Ambulatory Visit (INDEPENDENT_AMBULATORY_CARE_PROVIDER_SITE_OTHER): Payer: Medicare HMO | Admitting: Gastroenterology

## 2023-04-30 ENCOUNTER — Ambulatory Visit: Payer: Medicare HMO | Admitting: Physical Medicine and Rehabilitation

## 2023-04-30 ENCOUNTER — Other Ambulatory Visit: Payer: Self-pay

## 2023-04-30 VITALS — BP 123/79 | HR 64

## 2023-04-30 DIAGNOSIS — M5416 Radiculopathy, lumbar region: Secondary | ICD-10-CM

## 2023-04-30 MED ORDER — METHYLPREDNISOLONE ACETATE 80 MG/ML IJ SUSP
80.0000 mg | Freq: Once | INTRAMUSCULAR | Status: AC
Start: 2023-04-30 — End: 2023-04-30
  Administered 2023-04-30: 80 mg

## 2023-04-30 NOTE — Progress Notes (Signed)
Functional Pain Scale - descriptive words and definitions  Uncomfortable (3)  Pain is present but can complete all ADL's/sleep is slightly affected and passive distraction only gives marginal relief. Mild range order  Average Pain  varies on activity   +Driver, -BT, -Dye Allergies.  Lower back pain on both sides that comes and goes on either side with some radiation in legs

## 2023-04-30 NOTE — Patient Instructions (Signed)

## 2023-05-01 NOTE — Progress Notes (Signed)
Carlos Vargas - 72 y.o. male MRN [id number]  Date of birth: [birth year]  Office Visit Note: Visit Date: 04/30/2023 PCP: Benetta Spar, MD Referred by: London Sheer, MD  Subjective: Chief Complaint  Patient presents with   Lower Back - Pain   HPI:  Carlos Vargas is a 72 y.o. male who comes in today at the request of Dr. Willia Craze for planned Left L4-5 Lumbar Transforaminal epidural steroid injection with fluoroscopic guidance.  The patient has failed conservative care including home exercise, medications, time and activity modification.  This injection will be diagnostic and hopefully therapeutic.  Please see requesting physician notes for further details and justification.   ROS Otherwise per HPI.  Assessment & Plan: Visit Diagnoses:    ICD-10-CM   1. Lumbar radiculopathy  M54.16 XR C-ARM NO REPORT    Epidural Steroid injection    methylPREDNISolone acetate (DEPO-MEDROL) injection 80 mg      Plan: No additional findings.   Meds & Orders:  Meds ordered this encounter  Medications   methylPREDNISolone acetate (DEPO-MEDROL) injection 80 mg    Orders Placed This Encounter  Procedures   XR C-ARM NO REPORT   Epidural Steroid injection    Follow-up: Return for visit to requesting provider as needed.   Procedures: No procedures performed  Lumbosacral Transforaminal Epidural Steroid Injection - Sub-Pedicular Approach with Fluoroscopic Guidance  Patient: Carlos Vargas      Date of Birth: 1951/09/07 MRN: [id number] PCP: Benetta Spar, MD      Visit Date: 04/30/2023   Universal Protocol:    Date/Time: 04/30/2023  Consent Given By: the patient  Position: PRONE  Additional Comments: Vital signs were monitored before and after the procedure. Patient was prepped and draped in the usual sterile fashion. The correct patient, procedure, and site was verified.   Injection Procedure Details:   Procedure diagnoses: Lumbar radiculopathy [M54.16]     Meds Administered:  Meds ordered this encounter  Medications   methylPREDNISolone acetate (DEPO-MEDROL) injection 80 mg    Laterality: Left  Location/Site: L4  Needle:5.0 in., 22 ga.  Short bevel or Quincke spinal needle  Needle Placement: Transforaminal  Findings:    -Comments: Excellent flow of contrast along the nerve, nerve root and into the epidural space.  Procedure Details: After squaring off the end-plates to get a true AP view, the C-arm was positioned so that an oblique view of the foramen as noted above was visualized. The target area is just inferior to the "nose of the scotty dog" or sub pedicular. The soft tissues overlying this structure were infiltrated with 2-3 ml. of 1% Lidocaine without Epinephrine.  The spinal needle was inserted toward the target using a "trajectory" view along the fluoroscope beam.  Under AP and lateral visualization, the needle was advanced so it did not puncture dura and was located close the 6 O'Clock position of the pedical in AP tracterory. Biplanar projections were used to confirm position. Aspiration was confirmed to be negative for CSF and/or blood. A 1-2 ml. volume of Isovue-250 was injected and flow of contrast was noted at each level. Radiographs were obtained for documentation purposes.   After attaining the desired flow of contrast documented above, a 0.5 to 1.0 ml test dose of 0.25% Marcaine was injected into each respective transforaminal space.  The patient was observed for 90 seconds post injection.  After no sensory deficits were reported, and normal lower extremity motor function was noted,   the above injectate was administered so  that equal amounts of the injectate were placed at each foramen (level) into the transforaminal epidural space.   Additional Comments:  No complications occurred Dressing: 2 x 2 sterile gauze and Band-Aid    Post-procedure details: Patient was observed during the procedure. Post-procedure  instructions were reviewed.  Patient left the clinic in stable condition.    Clinical History: MRI LUMBAR SPINE WITHOUT CONTRAST   TECHNIQUE: Multiplanar, multisequence MR imaging of the lumbar spine was performed. No intravenous contrast was administered.   COMPARISON:  None Available.   FINDINGS: Segmentation:  Standard.   Alignment:  Physiologic.   Vertebrae: No acute fracture, evidence of discitis, or aggressive bone lesion.   Conus medullaris and cauda equina: Conus extends to the L1 level. Conus and cauda equina appear normal.   Paraspinal and other soft tissues: No acute paraspinal abnormality.   Disc levels:   Disc spaces: Degenerative disease with disc height loss at L4-5 and L5-S1 with reactive endplate edema. Mild disc height loss at L3-4.   T11-12: Mild broad-based disc bulge. Mild bilateral foraminal narrowing. No spinal stenosis.   T12-L1: No significant disc bulge. Mild bilateral facet arthropathy. No foraminal or central canal stenosis.   L1-L2: Mild broad-based disc bulge. Moderate bilateral facet arthropathy. Mild spinal stenosis. Mild bilateral foraminal stenosis.   L2-L3: Mild broad-based disc bulge. Moderate bilateral facet arthropathy. Moderate spinal stenosis. Bilateral lateral recess stenosis. No foraminal stenosis.   L3-L4: Broad-based disc bulge. Moderate bilateral facet arthropathy. Moderate spinal stenosis. Mild bilateral foraminal stenosis.   L4-L5: Broad-based disc osteophyte complex. Moderate bilateral facet arthropathy. Severe spinal stenosis. Moderate bilateral foraminal stenosis.   L5-S1: Broad-based disc bulge. Moderate bilateral facet arthropathy. Mild bilateral foraminal stenosis. No spinal stenosis.   IMPRESSION: 1. 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 2. No acute osseous injury of the lumbar spine.     Electronically Signed   By: Elige Ko M.D.   On: 04/18/2023 07:32     Objective:  VS:  HT:     WT:   BMI:     BP:123/79  HR:64bpm  TEMP: ( )  RESP:  Physical Exam Vitals and nursing note reviewed.  Constitutional:      General: He is not in acute distress.    Appearance: Normal appearance. He is not ill-appearing.  HENT:     Head: Normocephalic and atraumatic.     Right Ear: External ear normal.     Left Ear: External ear normal.     Nose: No congestion.  Eyes:     Extraocular Movements: Extraocular movements intact.  Cardiovascular:     Rate and Rhythm: Normal rate.     Pulses: Normal pulses.  Pulmonary:     Effort: Pulmonary effort is normal. No respiratory distress.  Abdominal:     General: There is no distension.     Palpations: Abdomen is soft.  Musculoskeletal:        General: No tenderness or signs of injury.     Cervical back: Neck supple.     Right lower leg: No edema.     Left lower leg: No edema.     Comments: Patient has good distal strength without clonus.  Skin:    Findings: No erythema or rash.  Neurological:     General: No focal deficit present.     Mental Status: He is alert and oriented to person, place, and time.     Sensory: No sensory deficit.     Motor: No weakness or abnormal  muscle tone.     Coordination: Coordination normal.  Psychiatric:        Mood and Affect: Mood normal.        Behavior: Behavior normal.      Imaging: XR C-ARM NO REPORT  Result Date: 04/30/2023 Please see Notes tab for imaging impression.

## 2023-05-01 NOTE — Procedures (Signed)
Lumbosacral Transforaminal Epidural Steroid Injection - Sub-Pedicular Approach with Fluoroscopic Guidance  Patient: Carlos Vargas      Date of Birth: 1951/04/22 MRN: [id number] PCP: Benetta Spar, MD      Visit Date: 04/30/2023   Universal Protocol:    Date/Time: 04/30/2023  Consent Given By: the patient  Position: PRONE  Additional Comments: Vital signs were monitored before and after the procedure. Patient was prepped and draped in the usual sterile fashion. The correct patient, procedure, and site was verified.   Injection Procedure Details:   Procedure diagnoses: Lumbar radiculopathy [M54.16]    Meds Administered:  Meds ordered this encounter  Medications   methylPREDNISolone acetate (DEPO-MEDROL) injection 80 mg    Laterality: Left  Location/Site: L4  Needle:5.0 in., 22 ga.  Short bevel or Quincke spinal needle  Needle Placement: Transforaminal  Findings:    -Comments: Excellent flow of contrast along the nerve, nerve root and into the epidural space.  Procedure Details: After squaring off the end-plates to get a true AP view, the C-arm was positioned so that an oblique view of the foramen as noted above was visualized. The target area is just inferior to the "nose of the scotty dog" or sub pedicular. The soft tissues overlying this structure were infiltrated with 2-3 ml. of 1% Lidocaine without Epinephrine.  The spinal needle was inserted toward the target using a "trajectory" view along the fluoroscope beam.  Under AP and lateral visualization, the needle was advanced so it did not puncture dura and was located close the 6 O'Clock position of the pedical in AP tracterory. Biplanar projections were used to confirm position. Aspiration was confirmed to be negative for CSF and/or blood. A 1-2 ml. volume of Isovue-250 was injected and flow of contrast was noted at each level. Radiographs were obtained for documentation purposes.   After attaining the desired  flow of contrast documented above, a 0.5 to 1.0 ml test dose of 0.25% Marcaine was injected into each respective transforaminal space.  The patient was observed for 90 seconds post injection.  After no sensory deficits were reported, and normal lower extremity motor function was noted,   the above injectate was administered so that equal amounts of the injectate were placed at each foramen (level) into the transforaminal epidural space.   Additional Comments:  No complications occurred Dressing: 2 x 2 sterile gauze and Band-Aid    Post-procedure details: Patient was observed during the procedure. Post-procedure instructions were reviewed.  Patient left the clinic in stable condition.

## 2023-05-05 DIAGNOSIS — I10 Essential (primary) hypertension: Secondary | ICD-10-CM | POA: Diagnosis not present

## 2023-05-05 DIAGNOSIS — E785 Hyperlipidemia, unspecified: Secondary | ICD-10-CM | POA: Diagnosis not present

## 2023-05-20 ENCOUNTER — Telehealth: Payer: Self-pay | Admitting: Orthopedic Surgery

## 2023-05-20 DIAGNOSIS — M5416 Radiculopathy, lumbar region: Secondary | ICD-10-CM

## 2023-05-20 MED ORDER — TRAMADOL HCL 50 MG PO TABS: 50.0000 mg | ORAL_TABLET | Freq: Four times a day (QID) | ORAL | 0 refills | Status: DC | PRN

## 2023-05-20 NOTE — Telephone Encounter (Signed)
Katrina called in for Carlos Vargas (she states she is his wife but I do not see her listed in his chart).  She has 2 requests 1) a different RX from Dr. Christell Constant because the Lyrica "is not working" and 2) they would like another ESI scheduled with Dr. Alvester Morin.  Per Katrina, patient uses the [facility] in [place].  Call back # for Katrina is [phone].

## 2023-05-21 DIAGNOSIS — N529 Male erectile dysfunction, unspecified: Secondary | ICD-10-CM | POA: Diagnosis not present

## 2023-05-21 DIAGNOSIS — L03031 Cellulitis of right toe: Secondary | ICD-10-CM | POA: Diagnosis not present

## 2023-05-21 DIAGNOSIS — R7303 Prediabetes: Secondary | ICD-10-CM | POA: Diagnosis not present

## 2023-05-21 NOTE — Telephone Encounter (Signed)
I called and Lmom for Carlos Vargas, advised that since she is not listed on his chart that I cannot give her any information at this time. Advised that he can give [place] a verbal auth 1 time to speak with her but that he needs to have her added to the chart to be able to speak with her in the future

## 2023-05-28 ENCOUNTER — Ambulatory Visit: Payer: Medicare HMO | Admitting: Orthopedic Surgery

## 2023-05-28 DIAGNOSIS — M5442 Lumbago with sciatica, left side: Secondary | ICD-10-CM | POA: Diagnosis not present

## 2023-05-28 DIAGNOSIS — G8929 Other chronic pain: Secondary | ICD-10-CM | POA: Diagnosis not present

## 2023-05-28 MED ORDER — TRAMADOL HCL 50 MG PO TABS: 100.0000 mg | ORAL_TABLET | Freq: Four times a day (QID) | ORAL | 0 refills | Status: AC | PRN

## 2023-05-28 NOTE — Progress Notes (Signed)
Orthopedic Spine Surgery Office Note   Assessment: Patient is a 72 y.o. male with low back pain that radiates into the bilateral buttock and left posterior proximal thigh. 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  Accordingly, recommended pain management -He said he did get some relief with the injection to his lumbar spine and wanted another one.  Referral was provided to Dr. Alvester Morin.  I told him that I do not think that this is a great long-term solution since they are not providing him with long-lasting relief and so he can probably do one more but then pain management would be to the longer term solution -Patient should return to office on an as needed basis     Patient expressed understanding of the plan and all questions were answered to the patient's satisfaction.    ___________________________________________________________________________     History:   Patient is a 72 y.o. male who presents today for follow up on his lumbar spine.  Patient is still having low back pain that radiates into the bilateral buttock and the left posterior thigh.  He states that has gotten better since using tramadol.  He also feels that the injection with Dr. Alvester Morin provided him with a couple weeks of relief to the point that he is able to do more.  It did not provide him with complete relief.  He has been able to work with his current pain level.  He said he is not considering surgery as an option and would not agree to a surgery as a treatment option.     Physical Exam:   General: no acute distress, appears stated age Neurologic: alert, answering questions appropriately, following commands Respiratory: unlabored breathing on room air,  symmetric chest rise Psychiatric: appropriate affect, normal cadence to speech     MSK (spine):   -Strength exam                                                   Left                  Right EHL                              5/5                  5/5 TA                                 5/5                  5/5 GSC                             5/5                  5/5 Knee extension            5/5  5/5 Hip flexion                    5/5                  5/5   -Sensory exam                           Sensation intact to light touch in L3-S1 nerve distributions of bilateral lower extremities    Imaging: XR of the lumbar spine from 02/04/2023 and 02/26/2023 was previously independently reviewed and interpreted, showing disc height loss at L3/4, L4/5, and L5/S1. Anterior bridging syndesmophytes are seen at L3/4, L5/S1.  No fracture or dislocation seen. No evidence of instability on flexion/extension views.    MRI of the lumbar from 04/08/2023 was previously independently reviewed and interpreted, showing L4/5 central and lateral recess stenosis. Has bilateral foraminal stenosis at L5/S1,    Patient name: Carlos Vargas Patient MRN: [id number] Date of visit: 05/28/23

## 2023-06-04 ENCOUNTER — Other Ambulatory Visit: Payer: Self-pay | Admitting: Orthopedic Surgery

## 2023-06-09 ENCOUNTER — Other Ambulatory Visit: Payer: Self-pay

## 2023-06-10 ENCOUNTER — Telehealth: Payer: Self-pay | Admitting: Gastroenterology

## 2023-06-10 ENCOUNTER — Other Ambulatory Visit: Payer: Self-pay | Admitting: Orthopedic Surgery

## 2023-06-10 NOTE — Telephone Encounter (Signed)
Called the patient to confirm his appointment and co pay for Monday 06/10/23 with Dr. Tobi Bastos. Patient said he want his appointment change to Mychart.

## 2023-06-11 ENCOUNTER — Telehealth (INDEPENDENT_AMBULATORY_CARE_PROVIDER_SITE_OTHER): Payer: Medicare HMO | Admitting: Gastroenterology

## 2023-06-11 ENCOUNTER — Ambulatory Visit (INDEPENDENT_AMBULATORY_CARE_PROVIDER_SITE_OTHER): Payer: Medicare HMO | Admitting: Physical Medicine and Rehabilitation

## 2023-06-11 ENCOUNTER — Encounter: Payer: Self-pay | Admitting: Gastroenterology

## 2023-06-11 ENCOUNTER — Other Ambulatory Visit: Payer: Self-pay

## 2023-06-11 VITALS — BP 134/84 | HR 50

## 2023-06-11 DIAGNOSIS — M5416 Radiculopathy, lumbar region: Secondary | ICD-10-CM

## 2023-06-11 DIAGNOSIS — Z01818 Encounter for other preprocedural examination: Secondary | ICD-10-CM | POA: Diagnosis not present

## 2023-06-11 DIAGNOSIS — Z1211 Encounter for screening for malignant neoplasm of colon: Secondary | ICD-10-CM

## 2023-06-11 MED ORDER — METHYLPREDNISOLONE ACETATE 80 MG/ML IJ SUSP
80.0000 mg | Freq: Once | INTRAMUSCULAR | Status: AC
  Administered 2023-06-11: 80 mg

## 2023-06-11 NOTE — Progress Notes (Signed)
Carlos Vargas , MD [address]  [address]  [place], [place] [address]  Main: [phone]  Fax: [phone]   Primary Care Physician: Benetta Spar, MD  Virtual Visit via Video Note  I connected with patient on 06/11/23 at  1:00 PM EDT by video and verified that I am speaking with the correct person using two identifiers.   I discussed the limitations, risks, security and privacy concerns of performing an evaluation and management service by video  and the availability of in person appointments. I also discussed with the patient that there may be a patient responsible charge related to this service. The patient expressed understanding and agreed to proceed.  Location of Patient: Home Location of Provider: Home Persons involved: Patient and provider only   History of Present Illness: Chief Complaint  Patient presents with   New Patient (Initial Visit)    HPI: Carlos Vargas is a 72 y.o. male   He is here today to talk to me about a screening colonoscopy last one was over 15 years back no polyps were found.  No family history of colon cancer or polyps in the first-degree relatives.  No change in bowel habits no change in shape of his stool no pain during defecation no blood in his stool no other symptoms.  Wanted     Current Outpatient Medications  Medication Sig Dispense Refill   amLODipine (NORVASC) 2.5 MG tablet Take 2.5 mg by mouth every morning.     atorvastatin (LIPITOR) 20 MG tablet Take 20 mg by mouth at bedtime.     cyclobenzaprine (FLEXERIL) 10 MG tablet Take 1 tablet (10 mg total) by mouth 2 (two) times daily as needed. 20 tablet 0   ibuprofen (ADVIL) 800 MG tablet Take 800 mg by mouth every 8 (eight) hours as needed.     methylPREDNISolone (MEDROL DOSEPAK) 4 MG TBPK tablet Take as prescribed on the box 21 tablet 0   pregabalin (LYRICA) 75 MG capsule Take 1 capsule by mouth twice daily 70 capsule 0   tadalafil (CIALIS) 5 MG tablet Take 5 mg by mouth  daily as needed.     traMADol (ULTRAM) 50 MG tablet Take 1 tablet (50 mg total) by mouth every 6 (six) hours as needed for up to 5 days. 20 tablet 0   Current Facility-Administered Medications  Medication Dose Route Frequency Provider Last Rate Last Admin   methylPREDNISolone acetate (DEPO-MEDROL) injection 80 mg  80 mg Other Once         Allergies as of 06/11/2023   (No Known Allergies)    Review of Systems:    All systems reviewed and negative except where noted in HPI.  General Appearance:    Alert, cooperative, no distress, appears stated age  Head:    Normocephalic, without obvious abnormality, atraumatic  Eyes:    PERRL, conjunctiva/corneas clear,  Ears:    Grossly normal hearing    Neurologic:  Grossly normal    Observations/Objective:  Labs: CMP     Component Value Date/Time   NA 140 04/09/2021 0623   K 4.7 04/09/2021 0623   CL 104 04/09/2021 0623   CO2 30 04/09/2021 0623   GLUCOSE 102 (H) 04/09/2021 0623   BUN 10 04/09/2021 0623   CREATININE 0.88 04/09/2021 0623   CALCIUM 9.0 04/09/2021 0623   PROT 7.5 10/09/2015 0030   ALBUMIN 3.9 10/09/2015 0030   AST 19 10/09/2015 0030   ALT 14 (L) 10/09/2015 0030   ALKPHOS 44  10/09/2015 0030   BILITOT 0.5 10/09/2015 0030   GFRNONAA >60 04/09/2021 0623   GFRAA >60 10/09/2015 0030   Lab Results  Component Value Date   WBC 5.2 04/09/2021   HGB 13.3 04/09/2021   HCT 41.4 04/09/2021   MCV 88.7 04/09/2021   PLT 230 04/09/2021    Imaging Studies: No results found.  Assessment and Plan:   [name] is a 72 y.o. y/o male discovered colon cancer screening and appropriateness at his age I explained to him that we proceed with colon cancer screening till the age of 62 and hence he would be eligible for one he denies any other major health issues at this point of time and he would be average risk and this could potentially be his last procedure if we do not find any polyps or lesions.  He agreed to proceed with a  colonoscopy   I have discussed alternative options, risks & benefits,  which include, but are not limited to, bleeding, infection, perforation,respiratory complication & drug reaction.  The patient agrees with this plan & written consent will be obtained.           I discussed the assessment and treatment plan with the patient. The patient was provided an opportunity to ask questions and all were answered. The patient agreed with the plan and demonstrated an understanding of the instructions.   The patient was advised to call back or seek an in-person evaluation if the symptoms worsen or if the condition fails to improve as anticipated.  I provided 16 minutes of face-to-face time during this encounter.  Dr Carlos Mood MD,MRCP [facility]) Gastroenterology/Hepatology Pager: [phone]   Speech recognition software was used to dictate this note.

## 2023-06-11 NOTE — Patient Instructions (Signed)

## 2023-06-11 NOTE — Telephone Encounter (Signed)
Called patient and asked if he had received the link to register to MyChart and he stated that he had not. I then asked Deshana to please send him the link and she stated that she would. Once I see that he is registered, I will change his appointment to a video visit.

## 2023-06-11 NOTE — Progress Notes (Signed)
Functional Pain Scale - descriptive words and definitions  Moderate (4)   Constantly aware of pain, can complete ADLs with modification/sleep marginally affected at times/passive distraction is of no use, but active distraction gives some relief. Moderate range order  Average Pain 3   +Driver, -BT, -Dye Allergies.  Lower back pain on left that radiates into the back of the left leg in the thigh

## 2023-06-12 ENCOUNTER — Other Ambulatory Visit: Payer: Self-pay

## 2023-06-12 DIAGNOSIS — Z1211 Encounter for screening for malignant neoplasm of colon: Secondary | ICD-10-CM

## 2023-06-12 MED ORDER — NA SULFATE-K SULFATE-MG SULF 17.5-3.13-1.6 GM/177ML PO SOLN: 354.0000 mL | Freq: Once | ORAL | 0 refills | Status: AC

## 2023-06-12 NOTE — Addendum Note (Signed)
Addended by: Adela Ports on: 06/12/2023 08:48 AM   Modules accepted: Orders

## 2023-06-12 NOTE — Progress Notes (Signed)
Patient was contacted to schedule his colonoscopy. He agreed on 06/18/2023 with Dr. Tobi Bastos. Instructions were provided and he was also informed that his prescription would be sent to his local pharmacy-[facility] in [place]. Patient understood and had no further questions.

## 2023-06-12 NOTE — Addendum Note (Signed)
Addended by: Adela Ports on: 06/12/2023 08:50 AM   Modules accepted: Orders

## 2023-06-17 ENCOUNTER — Encounter: Payer: Self-pay | Admitting: Gastroenterology

## 2023-06-18 ENCOUNTER — Encounter: Admission: RE | Payer: Self-pay | Source: Home / Self Care

## 2023-06-18 ENCOUNTER — Ambulatory Visit: Admission: RE | Admit: 2023-06-18 | Payer: Medicare HMO | Source: Home / Self Care | Admitting: Gastroenterology

## 2023-06-18 SURGERY — COLONOSCOPY WITH PROPOFOL
Anesthesia: General

## 2023-06-21 DIAGNOSIS — I10 Essential (primary) hypertension: Secondary | ICD-10-CM | POA: Diagnosis not present

## 2023-06-21 DIAGNOSIS — E785 Hyperlipidemia, unspecified: Secondary | ICD-10-CM | POA: Diagnosis not present

## 2023-06-24 NOTE — Procedures (Signed)
Lumbosacral Transforaminal Epidural Steroid Injection - Sub-Pedicular Approach with Fluoroscopic Guidance  Patient: Carlos Vargas      Date of Birth: September 30, 1951 MRN: [id number] PCP: Benetta Spar, MD      Visit Date: 06/11/2023   Universal Protocol:    Date/Time: 06/11/2023  Consent Given By: the patient  Position: PRONE  Additional Comments: Vital signs were monitored before and after the procedure. Patient was prepped and draped in the usual sterile fashion. The correct patient, procedure, and site was verified.   Injection Procedure Details:   Procedure diagnoses: Lumbar radiculopathy [M54.16]    Meds Administered:  Meds ordered this encounter  Medications   methylPREDNISolone acetate (DEPO-MEDROL) injection 80 mg    Laterality: Left  Location/Site: L4  Needle:5.0 in., 22 ga.  Short bevel or Quincke spinal needle  Needle Placement: Transforaminal  Findings:    -Comments: Excellent flow of contrast along the nerve, nerve root and into the epidural space.  Procedure Details: After squaring off the end-plates to get a true AP view, the C-arm was positioned so that an oblique view of the foramen as noted above was visualized. The target area is just inferior to the "nose of the scotty dog" or sub pedicular. The soft tissues overlying this structure were infiltrated with 2-3 ml. of 1% Lidocaine without Epinephrine.  The spinal needle was inserted toward the target using a "trajectory" view along the fluoroscope beam.  Under AP and lateral visualization, the needle was advanced so it did not puncture dura and was located close the 6 O'Clock position of the pedical in AP tracterory. Biplanar projections were used to confirm position. Aspiration was confirmed to be negative for CSF and/or blood. A 1-2 ml. volume of Isovue-250 was injected and flow of contrast was noted at each level. Radiographs were obtained for documentation purposes.   After attaining the desired  flow of contrast documented above, a 0.5 to 1.0 ml test dose of 0.25% Marcaine was injected into each respective transforaminal space.  The patient was observed for 90 seconds post injection.  After no sensory deficits were reported, and normal lower extremity motor function was noted,   the above injectate was administered so that equal amounts of the injectate were placed at each foramen (level) into the transforaminal epidural space.   Additional Comments:  The patient tolerated the procedure well Dressing: 2 x 2 sterile gauze and Band-Aid    Post-procedure details: Patient was observed during the procedure. Post-procedure instructions were reviewed.  Patient left the clinic in stable condition.

## 2023-06-24 NOTE — Progress Notes (Signed)
Carlos Vargas - 72 y.o. male MRN [id number]  Date of birth: [birth year]  Office Visit Note: Visit Date: 06/11/2023 PCP: Benetta Spar, MD Referred by: London Sheer, MD  Subjective: Chief Complaint  Patient presents with   Lower Back - Pain   HPI:  Carlos Vargas is a 72 y.o. male who comes in today at the request of Dr. Willia Craze for planned Left L4-5 Lumbar Transforaminal epidural steroid injection with fluoroscopic guidance.  The patient has failed conservative care including home exercise, medications, time and activity modification.  This injection will be diagnostic and hopefully therapeutic.  Please see requesting physician notes for further details and justification.   ROS Otherwise per HPI.  Assessment & Plan: Visit Diagnoses:    ICD-10-CM   1. Lumbar radiculopathy  M54.16 XR C-ARM NO REPORT    Epidural Steroid injection    methylPREDNISolone acetate (DEPO-MEDROL) injection 80 mg      Plan: No additional findings.   Meds & Orders:  Meds ordered this encounter  Medications   methylPREDNISolone acetate (DEPO-MEDROL) injection 80 mg    Orders Placed This Encounter  Procedures   XR C-ARM NO REPORT   Epidural Steroid injection    Follow-up: Return for visit to requesting provider as needed.   Procedures: No procedures performed  Lumbosacral Transforaminal Epidural Steroid Injection - Sub-Pedicular Approach with Fluoroscopic Guidance  Patient: Carlos Vargas      Date of Birth: [birth year] MRN: [id number] PCP: Benetta Spar, MD      Visit Date: 06/11/2023   Universal Protocol:    Date/Time: 06/11/2023  Consent Given By: the patient  Position: PRONE  Additional Comments: Vital signs were monitored before and after the procedure. Patient was prepped and draped in the usual sterile fashion. The correct patient, procedure, and site was verified.   Injection Procedure Details:   Procedure diagnoses: Lumbar radiculopathy [M54.16]     Meds Administered:  Meds ordered this encounter  Medications   methylPREDNISolone acetate (DEPO-MEDROL) injection 80 mg    Laterality: Left  Location/Site: L4  Needle:5.0 in., 22 ga.  Short bevel or Quincke spinal needle  Needle Placement: Transforaminal  Findings:    -Comments: Excellent flow of contrast along the nerve, nerve root and into the epidural space.  Procedure Details: After squaring off the end-plates to get a true AP view, the C-arm was positioned so that an oblique view of the foramen as noted above was visualized. The target area is just inferior to the "nose of the scotty dog" or sub pedicular. The soft tissues overlying this structure were infiltrated with 2-3 ml. of 1% Lidocaine without Epinephrine.  The spinal needle was inserted toward the target using a "trajectory" view along the fluoroscope beam.  Under AP and lateral visualization, the needle was advanced so it did not puncture dura and was located close the 6 O'Clock position of the pedical in AP tracterory. Biplanar projections were used to confirm position. Aspiration was confirmed to be negative for CSF and/or blood. A 1-2 ml. volume of Isovue-250 was injected and flow of contrast was noted at each level. Radiographs were obtained for documentation purposes.   After attaining the desired flow of contrast documented above, a 0.5 to 1.0 ml test dose of 0.25% Marcaine was injected into each respective transforaminal space.  The patient was observed for 90 seconds post injection.  After no sensory deficits were reported, and normal lower extremity motor function was noted,   the above injectate was administered so  that equal amounts of the injectate were placed at each foramen (level) into the transforaminal epidural space.   Additional Comments:  The patient tolerated the procedure well Dressing: 2 x 2 sterile gauze and Band-Aid    Post-procedure details: Patient was observed during the  procedure. Post-procedure instructions were reviewed.  Patient left the clinic in stable condition.    Clinical History: MRI LUMBAR SPINE WITHOUT CONTRAST   TECHNIQUE: Multiplanar, multisequence MR imaging of the lumbar spine was performed. No intravenous contrast was administered.   COMPARISON:  None Available.   FINDINGS: Segmentation:  Standard.   Alignment:  Physiologic.   Vertebrae: No acute fracture, evidence of discitis, or aggressive bone lesion.   Conus medullaris and cauda equina: Conus extends to the L1 level. Conus and cauda equina appear normal.   Paraspinal and other soft tissues: No acute paraspinal abnormality.   Disc levels:   Disc spaces: Degenerative disease with disc height loss at L4-5 and L5-S1 with reactive endplate edema. Mild disc height loss at L3-4.   T11-12: Mild broad-based disc bulge. Mild bilateral foraminal narrowing. No spinal stenosis.   T12-L1: No significant disc bulge. Mild bilateral facet arthropathy. No foraminal or central canal stenosis.   L1-L2: Mild broad-based disc bulge. Moderate bilateral facet arthropathy. Mild spinal stenosis. Mild bilateral foraminal stenosis.   L2-L3: Mild broad-based disc bulge. Moderate bilateral facet arthropathy. Moderate spinal stenosis. Bilateral lateral recess stenosis. No foraminal stenosis.   L3-L4: Broad-based disc bulge. Moderate bilateral facet arthropathy. Moderate spinal stenosis. Mild bilateral foraminal stenosis.   L4-L5: Broad-based disc osteophyte complex. Moderate bilateral facet arthropathy. Severe spinal stenosis. Moderate bilateral foraminal stenosis.   L5-S1: Broad-based disc bulge. Moderate bilateral facet arthropathy. Mild bilateral foraminal stenosis. No spinal stenosis.   IMPRESSION: 1. 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 2. No acute osseous injury of the lumbar spine.     Electronically Signed   By: Elige Ko M.D.   On: 04/18/2023 07:32      Objective:  VS:  HT:    WT:   BMI:     BP:134/84  HR:(!) 50bpm  TEMP: ( )  RESP:  Physical Exam Vitals and nursing note reviewed.  Constitutional:      General: He is not in acute distress.    Appearance: Normal appearance. He is not ill-appearing.  HENT:     Head: Normocephalic and atraumatic.     Right Ear: External ear normal.     Left Ear: External ear normal.     Nose: No congestion.  Eyes:     Extraocular Movements: Extraocular movements intact.  Cardiovascular:     Rate and Rhythm: Normal rate.     Pulses: Normal pulses.  Pulmonary:     Effort: Pulmonary effort is normal. No respiratory distress.  Abdominal:     General: There is no distension.     Palpations: Abdomen is soft.  Musculoskeletal:        General: No tenderness or signs of injury.     Cervical back: Neck supple.     Right lower leg: No edema.     Left lower leg: No edema.     Comments: Patient has good distal strength without clonus.  Skin:    Findings: No erythema or rash.  Neurological:     General: No focal deficit present.     Mental Status: He is alert and oriented to person, place, and time.     Sensory: No sensory deficit.     Motor:  No weakness or abnormal muscle tone.     Coordination: Coordination normal.  Psychiatric:        Mood and Affect: Mood normal.        Behavior: Behavior normal.      Imaging: No results found.

## 2023-06-30 ENCOUNTER — Other Ambulatory Visit: Payer: Self-pay

## 2023-06-30 ENCOUNTER — Telehealth: Payer: Self-pay | Admitting: Gastroenterology

## 2023-06-30 DIAGNOSIS — Z1211 Encounter for screening for malignant neoplasm of colon: Secondary | ICD-10-CM

## 2023-06-30 NOTE — Telephone Encounter (Signed)
Called patient to reschedule his procedure and he did not pick up. I will try to call him back since I couldn't leave a voicemail.

## 2023-06-30 NOTE — Telephone Encounter (Signed)
Called Mrs. Yzaguirre back and I was able to speak with her and she stated that she would like her husband's procedure to be on 08/05/2023. I reminded her on what her husband needed to do prior and she understood and had no further questions.

## 2023-06-30 NOTE — Telephone Encounter (Signed)
Patient wife (Katrina) called in to reschedule her husband colonoscopy.

## 2023-07-14 ENCOUNTER — Other Ambulatory Visit (HOSPITAL_COMMUNITY): Payer: Self-pay | Admitting: Gerontology

## 2023-07-14 ENCOUNTER — Telehealth: Payer: Self-pay

## 2023-07-14 DIAGNOSIS — R1033 Periumbilical pain: Secondary | ICD-10-CM | POA: Diagnosis not present

## 2023-07-14 DIAGNOSIS — M130 Polyarthritis, unspecified: Secondary | ICD-10-CM | POA: Diagnosis not present

## 2023-07-14 DIAGNOSIS — K219 Gastro-esophageal reflux disease without esophagitis: Secondary | ICD-10-CM | POA: Diagnosis not present

## 2023-07-14 DIAGNOSIS — E785 Hyperlipidemia, unspecified: Secondary | ICD-10-CM | POA: Diagnosis not present

## 2023-07-14 DIAGNOSIS — R109 Unspecified abdominal pain: Secondary | ICD-10-CM

## 2023-07-14 DIAGNOSIS — I10 Essential (primary) hypertension: Secondary | ICD-10-CM | POA: Diagnosis not present

## 2023-07-14 NOTE — Telephone Encounter (Signed)
PT spouse called stating she wasn't aware of the date of her husband procedure. I gave pt spouse date 08-05-2023.

## 2023-07-21 ENCOUNTER — Encounter (HOSPITAL_COMMUNITY): Payer: Self-pay

## 2023-07-21 ENCOUNTER — Ambulatory Visit (HOSPITAL_COMMUNITY): Payer: Medicare HMO

## 2023-08-04 ENCOUNTER — Other Ambulatory Visit: Payer: Self-pay | Admitting: Orthopedic Surgery

## 2023-08-05 ENCOUNTER — Ambulatory Visit: Payer: Medicare HMO | Admitting: Certified Registered"

## 2023-08-05 ENCOUNTER — Encounter: Payer: Self-pay | Admitting: Gastroenterology

## 2023-08-05 ENCOUNTER — Ambulatory Visit
Admission: RE | Admit: 2023-08-05 | Discharge: 2023-08-05 | Disposition: A | Discharge status: Home / Self Care | Payer: Medicare HMO | Attending: Gastroenterology | Admitting: Gastroenterology

## 2023-08-05 ENCOUNTER — Encounter
Admission: RE | Disposition: A | Discharge status: Home / Self Care | Payer: Self-pay | Source: Home / Self Care | Attending: Gastroenterology

## 2023-08-05 DIAGNOSIS — Z87891 Personal history of nicotine dependence: Secondary | ICD-10-CM | POA: Diagnosis not present

## 2023-08-05 DIAGNOSIS — Z1211 Encounter for screening for malignant neoplasm of colon: Secondary | ICD-10-CM | POA: Diagnosis not present

## 2023-08-05 DIAGNOSIS — I10 Essential (primary) hypertension: Secondary | ICD-10-CM | POA: Insufficient documentation

## 2023-08-05 DIAGNOSIS — Z8673 Personal history of transient ischemic attack (TIA), and cerebral infarction without residual deficits: Secondary | ICD-10-CM | POA: Insufficient documentation

## 2023-08-05 HISTORY — PX: COLONOSCOPY WITH PROPOFOL: SHX5780

## 2023-08-05 SURGERY — COLONOSCOPY WITH PROPOFOL
Anesthesia: General

## 2023-08-05 MED ORDER — SODIUM CHLORIDE 0.9 % IV SOLN
INTRAVENOUS | Status: DC
  Administered 2023-08-05: 20 mL/h via INTRAVENOUS

## 2023-08-05 MED ORDER — PROPOFOL 10 MG/ML IV BOLUS
INTRAVENOUS | Status: DC | PRN
Start: 2023-08-05 — End: 2023-08-05
  Administered 2023-08-05: 160 ug/kg/min via INTRAVENOUS
  Administered 2023-08-05: 120 mg via INTRAVENOUS

## 2023-08-05 MED ORDER — LIDOCAINE HCL (CARDIAC) PF 100 MG/5ML IV SOSY
PREFILLED_SYRINGE | INTRAVENOUS | Status: DC | PRN
Start: 2023-08-05 — End: 2023-08-05
  Administered 2023-08-05: 100 mg via INTRAVENOUS

## 2023-08-05 MED ORDER — PROPOFOL 10 MG/ML IV BOLUS
INTRAVENOUS | Status: AC
  Filled 2023-08-05: qty 60

## 2023-08-05 NOTE — Transfer of Care (Signed)
Immediate Anesthesia Transfer of Care Note  Patient: Carlos Vargas  Procedure(s) Performed: COLONOSCOPY WITH PROPOFOL  Patient Location: Endoscopy Unit  Anesthesia Type:General  Level of Consciousness: drowsy  Airway & Oxygen Therapy: Patient Spontanous Breathing  Post-op Assessment: Report given to RN and Post -op Vital signs reviewed and stable  Post vital signs: Reviewed and stable  Last Vitals:  Vitals Value Taken Time  BP 118/60 08/05/23 1014  Temp 35.7 C 08/05/23 1014  Pulse 59 08/05/23 1014  Resp 25 08/05/23 1014  SpO2 99 % 08/05/23 1014    Last Pain:  Vitals:   08/05/23 1014  TempSrc: Temporal  PainSc: Asleep         Complications: No notable events documented.

## 2023-08-05 NOTE — H&P (Signed)
Wyline Mood, MD [address], [place], [place], [address] [address], [place], [place], [address] Phone: [phone]  Fax: [phone]  Primary Care Physician:  Benetta Spar, MD   Pre-Procedure History & Physical: HPI:  Carlos Vargas is a 72 y.o. male is here for an colonoscopy.   History reviewed. No pertinent past medical history.  Past Surgical History:  Procedure Laterality Date   CHEST SURGERY  1969   shrapnel from [place] war    Prior to Admission medications   Medication Sig Start Date End Date Taking? Authorizing Provider  amLODipine (NORVASC) 2.5 MG tablet Take 2.5 mg by mouth every morning. 12/31/22  Yes [provider]  atorvastatin (LIPITOR) 20 MG tablet Take 20 mg by mouth at bedtime. 01/15/23  Yes [provider]  cyclobenzaprine (FLEXERIL) 10 MG tablet Take 1 tablet (10 mg total) by mouth 2 (two) times daily as needed. 02/17/23  Yes Oliver Barre, MD  ibuprofen (ADVIL) 800 MG tablet Take 800 mg by mouth every 8 (eight) hours as needed. 02/03/23  Yes [provider]  methylPREDNISolone (MEDROL DOSEPAK) 4 MG TBPK tablet Take as prescribed on the box 04/15/23  Yes London Sheer, MD  pregabalin (LYRICA) 75 MG capsule Take 1 capsule by mouth twice daily 08/04/23  Yes London Sheer, MD  tadalafil (CIALIS) 5 MG tablet Take 5 mg by mouth daily as needed. 05/21/23  Yes [provider]    Allergies as of 06/30/2023   (No Known Allergies)    Family History  Problem Relation Age of Onset   Diabetes Mother    Hypertension Father    Diabetes Sister    COPD Brother    Alzheimer's disease Maternal Grandmother    Cancer Paternal Grandfather     Social History   Socioeconomic History   Marital status: Married    Spouse name: Not on file   Number of children: Not on file   Years of education: Not on file   Highest education level: Not on file  Occupational History   Not on file  Tobacco Use    Smoking status: Former    Current packs/day: 0.33    Types: Cigarettes   Smokeless tobacco: Never  Vaping Use   Vaping status: Never Used  Substance and Sexual Activity   Alcohol use: No   Drug use: No   Sexual activity: Not Currently  Other Topics Concern   Not on file  Social History Narrative   Not on file   Social Determinants of Health   Financial Resource Strain: Not on file  Food Insecurity: Not on file  Transportation Needs: Not on file  Physical Activity: Not on file  Stress: Not on file  Social Connections: Not on file  Intimate Partner Violence: Not on file    Review of Systems: See HPI, otherwise negative ROS  Physical Exam: BP (!) 148/81   Pulse (!) 50   Temp (!) 96.6 F (35.9 C) (Temporal)   Resp 20   Ht 5' 11.5" (1.816 m)   Wt 81.1 kg   SpO2 99%   BMI 24.59 kg/m  General:   Alert,  pleasant and cooperative in NAD Head:  Normocephalic and atraumatic. Neck:  Supple; no masses or thyromegaly. Lungs:  Clear throughout to auscultation, normal respiratory effort.    Heart:  +S1, +S2, Regular rate and rhythm, No edema. Abdomen:  Soft, nontender and nondistended. Normal bowel sounds, without guarding, and without  rebound.   Neurologic:  Alert and  oriented x4;  grossly normal neurologically.  Impression/Plan: Carlos Vargas is here for an colonoscopy to be performed for Screening colonoscopy average risk   Risks, benefits, limitations, and alternatives regarding  colonoscopy have been reviewed with the patient.  Questions have been answered.  All parties agreeable.   Wyline Mood, MD  08/05/2023, 9:54 AM

## 2023-08-05 NOTE — Anesthesia Preprocedure Evaluation (Signed)
Anesthesia Evaluation  Patient identified by MRN, date of birth, ID band Patient awake    Reviewed: Allergy & Precautions, H&P , NPO status , Patient's Chart, lab work & pertinent test results, reviewed documented beta blocker date and time   History of Anesthesia Complications Negative for: history of anesthetic complications  Airway Mallampati: I  TM Distance: >3 FB Neck ROM: full    Dental  (+) Dental Advidsory Given, Missing, Partial Upper, Teeth Intact   Pulmonary neg pulmonary ROS, Continuous Positive Airway Pressure Ventilation , former smoker   Pulmonary exam normal breath sounds clear to auscultation       Cardiovascular Exercise Tolerance: Good hypertension, (-) angina (-) Past MI and (-) Cardiac Stents Normal cardiovascular exam(-) dysrhythmias (-) Valvular Problems/Murmurs Rhythm:regular Rate:Normal     Neuro/Psych neg Seizures CVA, No Residual Symptoms  negative psych ROS   GI/Hepatic negative GI ROS, Neg liver ROS,,,  Endo/Other  negative endocrine ROS    Renal/GU negative Renal ROS  negative genitourinary   Musculoskeletal   Abdominal   Peds  Hematology negative hematology ROS (+)   Anesthesia Other Findings History reviewed. No pertinent past medical history.   Reproductive/Obstetrics negative OB ROS                             Anesthesia Physical Anesthesia Plan  ASA: 2  Anesthesia Plan: General   Post-op Pain Management:    Induction: Intravenous  PONV Risk Score and Plan: 2 and Propofol infusion and TIVA  Airway Management Planned: Natural Airway and Nasal Cannula  Additional Equipment:   Intra-op Plan:   Post-operative Plan:   Informed Consent: I have reviewed the patients History and Physical, chart, labs and discussed the procedure including the risks, benefits and alternatives for the proposed anesthesia with the patient or authorized representative who  has indicated his/her understanding and acceptance.     Dental Advisory Given  Plan Discussed with: Anesthesiologist, CRNA and Surgeon  Anesthesia Plan Comments:        Anesthesia Quick Evaluation

## 2023-08-05 NOTE — Op Note (Signed)
[facility] Gastroenterology Patient Name: Carlos Vargas Procedure Date: 08/05/2023 9:54 AM MRN: [id number] Account #: [id number] Date of Birth: [birth year] Admit Type: Outpatient Age: 72 Room: [facility] ENDO ROOM 3 Gender: Male Note Status: Finalized Instrument Name: Prentice Docker [id number] Procedure:             Colonoscopy Indications:           Screening for colorectal malignant neoplasm Providers:             Wyline Mood MD, MD Referring MD:          Avon Gully MD, MD (Referring MD) Medicines:             Monitored Anesthesia Care Complications:         No immediate complications. Procedure:             Pre-Anesthesia Assessment:                        - Prior to the procedure, a History and Physical was                         performed, and patient medications, allergies and                         sensitivities were reviewed. The patient's tolerance                         of previous anesthesia was reviewed.                        - The risks and benefits of the procedure and the                         sedation options and risks were discussed with the                         patient. All questions were answered and informed                         consent was obtained.                        - ASA Grade Assessment: II - A patient with mild                         systemic disease.                        After obtaining informed consent, the colonoscope was                         passed under direct vision. Throughout the procedure,                         the patient's blood pressure, pulse, and oxygen                         saturations were monitored continuously. The                         Colonoscope was  introduced through the anus with the                         intention of advancing to the cecum. The scope was                         advanced to the transverse colon before the procedure                         was aborted. Medications were given. The  colonoscopy                         was performed with ease. The patient tolerated the                         procedure well. The quality of the bowel preparation                         was unsatisfactory. Findings:      The perianal and digital rectal examinations were normal.      A moderate amount of semi-solid stool was found in the sigmoid colon and       in the transverse colon. Impression:            - Preparation of the colon was unsatisfactory.                        - Stool in the sigmoid colon and in the transverse                         colon.                        - No specimens collected. Recommendation:        - Discharge patient to home (with escort).                        - Resume previous diet.                        - Continue present medications.                        - Repeat colonoscopy in 2 weeks because the bowel                         preparation was poor. Procedure Code(s):     --- Professional ---                        [phone], 53, Colonoscopy, flexible; diagnostic,                         including collection of specimen(s) by brushing or                         washing, when performed (separate procedure) Diagnosis Code(s):     --- Professional ---                        Z12.11, Encounter for screening  for malignant neoplasm                         of colon CPT copyright 2022 American Medical Association. All rights reserved. The codes documented in this report are preliminary and upon coder review may  be revised to meet current compliance requirements. Wyline Mood, MD Wyline Mood MD, MD 08/05/2023 10:12:19 AM This report has been signed electronically. Number of Addenda: 0 Note Initiated On: 08/05/2023 9:54 AM Total Procedure Duration: 0 hours 2 minutes 57 seconds  Estimated Blood Loss:  Estimated blood loss: none.      [facility]

## 2023-08-06 ENCOUNTER — Encounter: Payer: Self-pay | Admitting: Gastroenterology

## 2023-08-13 DIAGNOSIS — I10 Essential (primary) hypertension: Secondary | ICD-10-CM | POA: Diagnosis not present

## 2023-08-13 DIAGNOSIS — E785 Hyperlipidemia, unspecified: Secondary | ICD-10-CM | POA: Diagnosis not present

## 2023-08-15 NOTE — Anesthesia Postprocedure Evaluation (Signed)
Anesthesia Post Note  Patient: Carlos Vargas  Procedure(s) Performed: COLONOSCOPY WITH PROPOFOL  Patient location during evaluation: Endoscopy Anesthesia Type: General Level of consciousness: awake and alert Pain management: pain level controlled Vital Signs Assessment: post-procedure vital signs reviewed and stable Respiratory status: spontaneous breathing, nonlabored ventilation, respiratory function stable and patient connected to nasal cannula oxygen Cardiovascular status: blood pressure returned to baseline and stable Postop Assessment: no apparent nausea or vomiting Anesthetic complications: no   No notable events documented.   Last Vitals:  Vitals:   08/05/23 1014 08/05/23 1024  BP: 118/60 126/63  Pulse: (!) 59 65  Resp: (!) 25 (!) 22  Temp: (!) 35.7 C   SpO2: 99% 97%    Last Pain:  Vitals:   08/06/23 0732  TempSrc:   PainSc: 0-No pain                 Lenard Simmer

## 2023-08-26 ENCOUNTER — Other Ambulatory Visit: Payer: Self-pay | Admitting: Orthopedic Surgery

## 2023-09-13 DIAGNOSIS — I10 Essential (primary) hypertension: Secondary | ICD-10-CM | POA: Diagnosis not present

## 2023-09-13 DIAGNOSIS — E785 Hyperlipidemia, unspecified: Secondary | ICD-10-CM | POA: Diagnosis not present

## 2023-09-24 ENCOUNTER — Ambulatory Visit: Payer: Medicare HMO | Admitting: Orthopedic Surgery

## 2023-09-29 ENCOUNTER — Other Ambulatory Visit: Payer: Self-pay

## 2023-09-29 ENCOUNTER — Telehealth: Payer: Self-pay

## 2023-09-29 DIAGNOSIS — Z1211 Encounter for screening for malignant neoplasm of colon: Secondary | ICD-10-CM

## 2023-09-29 MED ORDER — GOLYTELY 236 G PO SOLR: 4000.0000 mL | Freq: Once | ORAL | 0 refills | Status: AC

## 2023-09-29 NOTE — Telephone Encounter (Signed)
Patient needs repeat colonoscopy and supposed to repeat in 2 weeks due to not being clean out. Called and got patient scheduled for 10/21/23 with Dr. Tobi Bastos. Went over instructions for 2 day prep and mailed them. Sent prep to the pharmacy

## 2023-10-13 DIAGNOSIS — I10 Essential (primary) hypertension: Secondary | ICD-10-CM | POA: Diagnosis not present

## 2023-10-13 DIAGNOSIS — E785 Hyperlipidemia, unspecified: Secondary | ICD-10-CM | POA: Diagnosis not present

## 2023-10-14 ENCOUNTER — Encounter: Payer: Self-pay | Admitting: Gastroenterology

## 2023-10-20 ENCOUNTER — Encounter: Payer: Self-pay | Admitting: Gastroenterology

## 2023-10-21 ENCOUNTER — Ambulatory Visit: Payer: Medicare HMO | Admitting: Certified Registered"

## 2023-10-21 ENCOUNTER — Ambulatory Visit
Admission: RE | Admit: 2023-10-21 | Discharge: 2023-10-21 | Disposition: A | Discharge status: Home / Self Care | Payer: Medicare HMO | Attending: Gastroenterology | Admitting: Gastroenterology

## 2023-10-21 ENCOUNTER — Encounter: Payer: Self-pay | Admitting: Gastroenterology

## 2023-10-21 ENCOUNTER — Encounter
Admission: RE | Disposition: A | Discharge status: Home / Self Care | Payer: Self-pay | Source: Home / Self Care | Attending: Gastroenterology

## 2023-10-21 DIAGNOSIS — Z1211 Encounter for screening for malignant neoplasm of colon: Secondary | ICD-10-CM | POA: Diagnosis not present

## 2023-10-21 DIAGNOSIS — I10 Essential (primary) hypertension: Secondary | ICD-10-CM | POA: Diagnosis not present

## 2023-10-21 DIAGNOSIS — Z87891 Personal history of nicotine dependence: Secondary | ICD-10-CM | POA: Diagnosis not present

## 2023-10-21 HISTORY — PX: COLONOSCOPY WITH PROPOFOL: SHX5780

## 2023-10-21 HISTORY — DX: Essential (primary) hypertension: I10

## 2023-10-21 SURGERY — COLONOSCOPY WITH PROPOFOL
Anesthesia: General

## 2023-10-21 MED ORDER — PROPOFOL 10 MG/ML IV BOLUS
INTRAVENOUS | Status: DC | PRN
  Administered 2023-10-21: 60 mg via INTRAVENOUS

## 2023-10-21 MED ORDER — SODIUM CHLORIDE 0.9 % IV SOLN: INTRAVENOUS | Status: DC

## 2023-10-21 MED ORDER — PROPOFOL 500 MG/50ML IV EMUL
INTRAVENOUS | Status: DC | PRN
  Administered 2023-10-21: 130 ug/kg/min via INTRAVENOUS

## 2023-10-21 MED ORDER — GLYCOPYRROLATE 0.2 MG/ML IJ SOLN
INTRAMUSCULAR | Status: DC | PRN
  Administered 2023-10-21 (×2): .2 mg via INTRAVENOUS

## 2023-10-21 NOTE — Transfer of Care (Signed)
Immediate Anesthesia Transfer of Care Note  Patient: Carlos Vargas  Procedure(s) Performed: COLONOSCOPY WITH PROPOFOL  Patient Location: Endoscopy Unit  Anesthesia Type:General  Level of Consciousness: drowsy  Airway & Oxygen Therapy: Patient Spontanous Breathing  Post-op Assessment: Report given to RN  Post vital signs: stable  Last Vitals:  Vitals Value Taken Time  BP 146/97 10/21/23 1047  Temp    Pulse 72 10/21/23 1047  Resp 11 10/21/23 1047  SpO2 100 % 10/21/23 1047  Vitals shown include unfiled device data.  Last Pain:  Vitals:   10/21/23 0923  TempSrc: Temporal  PainSc: 0-No pain         Complications: No notable events documented.

## 2023-10-21 NOTE — Anesthesia Postprocedure Evaluation (Signed)
Anesthesia Post Note  Patient: Carlos Vargas  Procedure(s) Performed: COLONOSCOPY WITH PROPOFOL  Patient location during evaluation: Endoscopy Anesthesia Type: General Level of consciousness: awake and alert Pain management: pain level controlled Vital Signs Assessment: post-procedure vital signs reviewed and stable Respiratory status: spontaneous breathing, nonlabored ventilation, respiratory function stable and patient connected to nasal cannula oxygen Cardiovascular status: blood pressure returned to baseline and stable Postop Assessment: no apparent nausea or vomiting Anesthetic complications: no  No notable events documented.   Last Vitals:  Vitals:   10/21/23 0923 10/21/23 1047  BP: (!) 174/77   Pulse: (!) 39   Resp: 18   Temp: (!) 35.9 C (!) 36 C  SpO2: 100%     Last Pain:  Vitals:   10/21/23 1047  TempSrc: Temporal  PainSc: 0-No pain                 Stephanie Coup

## 2023-10-21 NOTE — Anesthesia Preprocedure Evaluation (Signed)
Anesthesia Evaluation  Patient identified by MRN, date of birth, ID band Patient awake    Reviewed: Allergy & Precautions, H&P , NPO status , Patient's Chart, lab work & pertinent test results, reviewed documented beta blocker date and time   History of Anesthesia Complications Negative for: history of anesthetic complications  Airway Mallampati: I  TM Distance: >3 FB Neck ROM: full    Dental  (+) Dental Advidsory Given, Missing, Partial Upper, Teeth Intact   Pulmonary neg pulmonary ROS, former smoker   Pulmonary exam normal breath sounds clear to auscultation       Cardiovascular Exercise Tolerance: Good hypertension, (-) angina (-) Past MI and (-) Cardiac Stents Normal cardiovascular exam(-) dysrhythmias (-) Valvular Problems/Murmurs Rhythm:regular Rate:Normal     Neuro/Psych neg Seizures CVA, No Residual Symptoms  negative psych ROS   GI/Hepatic negative GI ROS, Neg liver ROS,,,  Endo/Other  negative endocrine ROS    Renal/GU      Musculoskeletal   Abdominal   Peds  Hematology negative hematology ROS (+)   Anesthesia Other Findings History reviewed. No pertinent past medical history.   Reproductive/Obstetrics negative OB ROS                             Anesthesia Physical Anesthesia Plan  ASA: 2  Anesthesia Plan: General   Post-op Pain Management: Minimal or no pain anticipated   Induction: Intravenous  PONV Risk Score and Plan: 3 and Propofol infusion, TIVA and Ondansetron  Airway Management Planned: Nasal Cannula  Additional Equipment: None  Intra-op Plan:   Post-operative Plan:   Informed Consent: I have reviewed the patients History and Physical, chart, labs and discussed the procedure including the risks, benefits and alternatives for the proposed anesthesia with the patient or authorized representative who has indicated his/her understanding and acceptance.      Dental advisory given  Plan Discussed with: CRNA and Surgeon  Anesthesia Plan Comments: (Discussed risks of anesthesia with patient, including possibility of difficulty with spontaneous ventilation under anesthesia necessitating airway intervention, PONV, and rare risks such as cardiac or respiratory or neurological events, and allergic reactions. Discussed the role of CRNA in patient's perioperative care. Patient understands.)       Anesthesia Quick Evaluation

## 2023-10-21 NOTE — H&P (Signed)
Arlyss Repress, MD [address]  [address]  [place], [place] [address]  Main: [phone]  Fax: [phone] Pager: [phone]  Primary Care Physician:  Benetta Spar, MD Primary Gastroenterologist:  Dr. Arlyss Repress  Pre-Procedure History & Physical: HPI:  Carlos Vargas is a 72 y.o. male is here for an colonoscopy.   Past Medical History:  Diagnosis Date   Hypertension     Past Surgical History:  Procedure Laterality Date   CHEST SURGERY  1969   shrapnel from [place] war   COLONOSCOPY WITH PROPOFOL N/A 08/05/2023   Procedure: COLONOSCOPY WITH PROPOFOL;  Surgeon: Wyline Mood, MD;  Location: [facility] ENDOSCOPY;  Service: Gastroenterology;  Laterality: N/A;    Prior to Admission medications   Medication Sig Start Date End Date Taking? Authorizing Provider  amLODipine (NORVASC) 2.5 MG tablet Take 2.5 mg by mouth every morning. 12/31/22  Yes [provider]  atorvastatin (LIPITOR) 20 MG tablet Take 20 mg by mouth at bedtime. 01/15/23  Yes [provider]  cyclobenzaprine (FLEXERIL) 10 MG tablet Take 1 tablet (10 mg total) by mouth 2 (two) times daily as needed. 02/17/23  Yes Oliver Barre, MD  ibuprofen (ADVIL) 800 MG tablet Take 800 mg by mouth every 8 (eight) hours as needed. 02/03/23  Yes [provider]  pregabalin (LYRICA) 75 MG capsule Take 1 capsule by mouth twice daily 08/26/23  Yes London Sheer, MD  methylPREDNISolone (MEDROL DOSEPAK) 4 MG TBPK tablet Take as prescribed on the box Patient not taking: Reported on 10/21/2023 04/15/23   London Sheer, MD  tadalafil (CIALIS) 5 MG tablet Take 5 mg by mouth daily as needed. 05/21/23   [provider]    Allergies as of 09/29/2023   (No Known Allergies)    Family History  Problem Relation Age of Onset   Diabetes Mother    Hypertension Father    Diabetes Sister    COPD Brother    Alzheimer's disease Maternal Grandmother    Cancer Paternal Grandfather     Social  History   Socioeconomic History   Marital status: Married    Spouse name: Not on file   Number of children: Not on file   Years of education: Not on file   Highest education level: Not on file  Occupational History   Not on file  Tobacco Use   Smoking status: Former    Current packs/day: 0.33    Types: Cigarettes   Smokeless tobacco: Never  Vaping Use   Vaping status: Never Used  Substance and Sexual Activity   Alcohol use: No   Drug use: No   Sexual activity: Not Currently  Other Topics Concern   Not on file  Social History Narrative   Not on file   Social Drivers of Health   Financial Resource Strain: Not on file  Food Insecurity: Not on file  Transportation Needs: Not on file  Physical Activity: Not on file  Stress: Not on file  Social Connections: Not on file  Intimate Partner Violence: Not on file    Review of Systems: See HPI, otherwise negative ROS  Physical Exam: BP (!) 174/77   Pulse (!) 39   Temp (!) 96.6 F (35.9 C) (Temporal)   Resp 18   Ht 6' (1.829 m)   Wt 82.7 kg   SpO2 100%   BMI 24.73 kg/m  General:   Alert,  pleasant and cooperative in NAD Head:  Normocephalic and atraumatic. Neck:  Supple;  no masses or thyromegaly. Lungs:  Clear throughout to auscultation.    Heart:  Regular rate and rhythm. Abdomen:  Soft, nontender and nondistended. Normal bowel sounds, without guarding, and without rebound.   Neurologic:  Alert and  oriented x4;  grossly normal neurologically.  Impression/Plan: Carlos Vargas is here for an colonoscopy to be performed for colon cancer screening  Risks, benefits, limitations, and alternatives regarding  colonoscopy have been reviewed with the patient.  Questions have been answered.  All parties agreeable.   Lannette Donath, MD  10/21/2023, 9:44 AM

## 2023-10-21 NOTE — Op Note (Signed)
[facility] Gastroenterology Patient Name: Carlos Vargas Procedure Date: 10/21/2023 10:20 AM MRN: [id number] Account #: [id number] Date of Birth: [birth year] Admit Type: Outpatient Age: 72 Room: [facility] ENDO ROOM 1 Gender: Male Note Status: Finalized Instrument Name: Prentice Docker [id number] Procedure:             Colonoscopy Indications:           Screening for colorectal malignant neoplasm Providers:             Toney Reil MD, MD Referring MD:          No Local Md, MD (Referring MD) Medicines:             General Anesthesia Complications:         No immediate complications. Estimated blood loss: None. Procedure:             Pre-Anesthesia Assessment:                        - Prior to the procedure, a History and Physical was                         performed, and patient medications and allergies were                         reviewed. The patient is competent. The risks and                         benefits of the procedure and the sedation options and                         risks were discussed with the patient. All questions                         were answered and informed consent was obtained.                         Patient identification and proposed procedure were                         verified by the physician, the nurse, the                         anesthesiologist, the anesthetist and the technician                         in the pre-procedure area in the procedure room in the                         endoscopy suite. Mental Status Examination: alert and                         oriented. Airway Examination: normal oropharyngeal                         airway and neck mobility. Respiratory Examination:                         clear to auscultation. CV Examination: normal.  Prophylactic Antibiotics: The patient does not require                         prophylactic antibiotics. Prior Anticoagulants: The                          patient has taken no anticoagulant or antiplatelet                         agents. ASA Grade Assessment: III - A patient with                         severe systemic disease. After reviewing the risks and                         benefits, the patient was deemed in satisfactory                         condition to undergo the procedure. The anesthesia                         plan was to use general anesthesia. Immediately prior                         to administration of medications, the patient was                         re-assessed for adequacy to receive sedatives. The                         heart rate, respiratory rate, oxygen saturations,                         blood pressure, adequacy of pulmonary ventilation, and                         response to care were monitored throughout the                         procedure. The physical status of the patient was                         re-assessed after the procedure.                        After obtaining informed consent, the colonoscope was                         passed under direct vision. Throughout the procedure,                         the patient's blood pressure, pulse, and oxygen                         saturations were monitored continuously. The                         Colonoscope was introduced through the anus and  advanced to the the cecum, identified by appendiceal                         orifice and ileocecal valve. The colonoscopy was                         performed without difficulty. The patient tolerated                         the procedure well. The quality of the bowel                         preparation was evaluated using the BBPS [facility] Bowel                         Preparation Scale) with scores of: Right Colon = 3,                         Transverse Colon = 3 and Left Colon = 3 (entire mucosa                         seen well with no residual staining, small fragments                          of stool or opaque liquid). The total BBPS score                         equals 9. The ileocecal valve, appendiceal orifice,                         and rectum were photographed. Findings:      The perianal and digital rectal examinations were normal. Pertinent       negatives include normal sphincter tone and no palpable rectal lesions.      The entire examined colon appeared normal.      The retroflexed view of the distal rectum and anal verge was normal and       showed no anal or rectal abnormalities. Impression:            - The entire examined colon is normal.                        - The distal rectum and anal verge are normal on                         retroflexion view.                        - No specimens collected. Recommendation:        - Discharge patient to home (with escort).                        - Resume previous diet today.                        - Continue present medications.                        - Repeat colonoscopy in  10 years for screening                         purposes. Procedure Code(s):     --- Professional ---                        Z6109, Colorectal cancer screening; colonoscopy on                         individual not meeting criteria for high risk Diagnosis Code(s):     --- Professional ---                        Z12.11, Encounter for screening for malignant neoplasm                         of colon CPT copyright 2022 American Medical Association. All rights reserved. The codes documented in this report are preliminary and upon coder review may  be revised to meet current compliance requirements. Dr. Libby Maw Toney Reil MD, MD 10/21/2023 10:45:36 AM This report has been signed electronically. Number of Addenda: 0 Note Initiated On: 10/21/2023 10:20 AM Scope Withdrawal Time: 0 hours 8 minutes 52 seconds  Total Procedure Duration: 0 hours 11 minutes 22 seconds  Estimated Blood Loss:  Estimated blood loss: none.      [facility]

## 2023-10-22 ENCOUNTER — Encounter: Payer: Self-pay | Admitting: Gastroenterology

## 2023-10-29 ENCOUNTER — Ambulatory Visit: Payer: Medicare HMO | Admitting: Orthopedic Surgery

## 2023-10-29 ENCOUNTER — Other Ambulatory Visit (INDEPENDENT_AMBULATORY_CARE_PROVIDER_SITE_OTHER): Payer: Self-pay

## 2023-10-29 DIAGNOSIS — M5442 Lumbago with sciatica, left side: Secondary | ICD-10-CM

## 2023-10-29 DIAGNOSIS — M5416 Radiculopathy, lumbar region: Secondary | ICD-10-CM

## 2023-10-29 DIAGNOSIS — G8929 Other chronic pain: Secondary | ICD-10-CM

## 2023-10-29 MED ORDER — PREGABALIN 75 MG PO CAPS: 75.0000 mg | ORAL_CAPSULE | Freq: Two times a day (BID) | ORAL | 2 refills | Status: DC

## 2023-10-29 NOTE — Progress Notes (Signed)
Orthopedic Spine Surgery Office Note   Assessment: Patient is a 72 y.o. male with low back pain that radiates into the bilateral buttock and left posterior proximal thigh. 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  I told him if that his pain gets bad enough that he is requiring narcotics, I will refer him to pain management -Patient should return to office on an as needed basis     Patient expressed understanding of the plan and all questions were answered to the patient's satisfaction.    ___________________________________________________________________________     History:   Patient is a 72 y.o. male who presents today for follow up on his lumbar spine.  Patient is having low back pain that radiates into his bilateral buttock and into the left posterior thigh.  He states the pain is worse when he is at work.  He notices around 2 PM.  Pain does get better if he sits down.  He has not noticed using a grocery cart at the store helps.  He has not developed any new symptoms since he was last seen.  After last visit, he got an injection with Dr. Alvester Morin and felt that that helped about 70% with his pain.  However, pain has gradually returned.     Physical Exam:   General: no acute distress, appears stated age Neurologic: alert, answering questions appropriately, following commands Respiratory: unlabored breathing on room air, symmetric chest rise Psychiatric: appropriate affect, normal cadence to speech     MSK (spine):   -Strength exam                                                   Left                  Right EHL                              5/5                  5/5 TA                                  5/5                  5/5 GSC                             5/5                  5/5 Knee extension            5/5                  5/5 Hip flexion                    5/5  5/5   -Sensory exam                           Sensation intact to light touch in L3-S1 nerve distributions of bilateral lower extremities     Imaging: XRs of the lumbar spine from 10/29/2023 were independently reviewed and interpreted, showing disc height loss at L3/4, L4/5, and L5/S1. Bridging syndesmophytes are seen anteriorly at L3/4, L5/S1.  No evidence of instability on flexion/extension views.  No fracture or dislocation seen.   MRI of the lumbar from 04/08/2023 was previously independently reviewed and interpreted, showing L4/5 central and lateral recess stenosis. Has bilateral foraminal stenosis at L5/S1,     Patient name: Carlos Vargas Patient MRN: [id number] Date of visit: 10/29/23

## 2023-11-13 DIAGNOSIS — I10 Essential (primary) hypertension: Secondary | ICD-10-CM | POA: Diagnosis not present

## 2023-11-13 DIAGNOSIS — E785 Hyperlipidemia, unspecified: Secondary | ICD-10-CM | POA: Diagnosis not present

## 2023-11-23 ENCOUNTER — Encounter: Payer: Self-pay | Admitting: Physician Assistant

## 2023-11-23 ENCOUNTER — Ambulatory Visit: Payer: Self-pay

## 2023-11-23 ENCOUNTER — Encounter: Payer: Self-pay | Admitting: Orthopedic Surgery

## 2023-11-23 ENCOUNTER — Ambulatory Visit (HOSPITAL_BASED_OUTPATIENT_CLINIC_OR_DEPARTMENT_OTHER): Payer: Medicare HMO | Admitting: Student

## 2023-11-23 ENCOUNTER — Telehealth: Payer: Medicare HMO | Admitting: Physician Assistant

## 2023-11-23 ENCOUNTER — Telehealth: Payer: Self-pay

## 2023-11-23 DIAGNOSIS — M5416 Radiculopathy, lumbar region: Secondary | ICD-10-CM

## 2023-11-23 MED ORDER — NAPROXEN 500 MG PO TABS: 500.0000 mg | ORAL_TABLET | Freq: Two times a day (BID) | ORAL | 0 refills | Status: AC

## 2023-11-23 MED ORDER — CYCLOBENZAPRINE HCL 10 MG PO TABS: 5.0000 mg | ORAL_TABLET | Freq: Three times a day (TID) | ORAL | 0 refills | Status: AC | PRN

## 2023-11-23 NOTE — Progress Notes (Signed)
Duplicate, Erroneous

## 2023-11-23 NOTE — Progress Notes (Signed)

## 2023-11-23 NOTE — Telephone Encounter (Signed)
 This encounter was created in error - please disregard.

## 2023-11-25 ENCOUNTER — Ambulatory Visit: Payer: Self-pay | Admitting: Orthopedic Surgery

## 2023-11-25 ENCOUNTER — Ambulatory Visit: Payer: Medicare HMO | Admitting: Physical Medicine and Rehabilitation

## 2023-11-25 ENCOUNTER — Encounter: Payer: Self-pay | Admitting: Physical Medicine and Rehabilitation

## 2023-11-25 DIAGNOSIS — M48062 Spinal stenosis, lumbar region with neurogenic claudication: Secondary | ICD-10-CM

## 2023-11-25 DIAGNOSIS — M47816 Spondylosis without myelopathy or radiculopathy, lumbar region: Secondary | ICD-10-CM

## 2023-11-25 DIAGNOSIS — M5416 Radiculopathy, lumbar region: Secondary | ICD-10-CM | POA: Diagnosis not present

## 2023-11-25 NOTE — Progress Notes (Signed)
Carlos Vargas - 73 y.o. male MRN [id number]  Date of birth: 25-Feb-1951  Office Visit Note: Visit Date: 11/25/2023 PCP: Benetta Spar, MD Referred by: Benetta Spar*  Subjective: Chief Complaint  Patient presents with   Lower Back - Pain   HPI: Carlos Vargas is a 73 y.o. male who comes in today for evaluation of chronic, worsening and severe bilateral lower back pain radiating to left buttock, lateral hip and down to knee. He is patient of Dr. Willia Craze. Pain ongoing for over a year, worsens with movement and activity. He describes his pain as sharp and aching sensation, currently rates as 9 out of 10. Does reports numbness and tingling to left upper leg. He has tried home exercise regimen, rest and use of medications. No relief of pain with Ibuprofen and Lyrica. History of chiropractic treatments that were beneficial in alleviating pain. Lumbar MRI imaging from June of 2024 shows multi level degenerative changes and moderate facet arthropathy, there is severe spinal canal stenosis at the level of L4-L5. Patient underwent left L4 transforaminal epidural steroid injection in our office on 06/11/2023, he reports greater than 50% relief of pain for over 3 months. Patient is here today to discuss treatment options, he is very adamate about avoiding surgery. Dr. Christell Constant did place referral back in July to [facility] for chronic pain management, patient has not been seen as of yet. Patient currently working for [employer]. Patient denies focal weakness, no recent trauma or falls.       Review of Systems  Musculoskeletal:  Positive for back pain.  Neurological:  Positive for tingling. Negative for focal weakness and weakness.  All other systems reviewed and are negative.  Otherwise per HPI.  Assessment & Plan: Visit Diagnoses:    ICD-10-CM   1. Lumbar radiculopathy  M54.16 Ambulatory referral to Physical Medicine Rehab    2. Spinal stenosis of lumbar region with  neurogenic claudication  M48.062 Ambulatory referral to Physical Medicine Rehab    3. Facet arthropathy, lumbar  M47.816 Ambulatory referral to Physical Medicine Rehab       Plan: Findings:  Chronic, worsening and severe bilateral lower back pain radiating to left buttock, lateral hip and down to knee. Patient continues to have severe pain despite good conservative therapies such as home exercise regimen, rest and use of medications. Patients clinical presentation and exam are consistent with neurogenic claudication as a result of spinal canal stenosis. There is severe multi factorial central stenosis at the level of L4-L5. Dr. Christell Constant did speak with patient about surgery, he does not wish to proceed with surgical intervention at this time. Next step is to perform diagnostic and hopefully therapeutic left L4 transforaminal epidural steroid injection under fluoroscopic guidance. If good relief of pain with injection we can repeat this procedure infrequently as needed. Would recommend patient follow up with Dr. Christell Constant, should his pain persist I do feel chronic pain management would be best option for him. No red flag symptoms noted upon exam today.     Meds & Orders: No orders of the defined types were placed in this encounter.   Orders Placed This Encounter  Procedures   Ambulatory referral to Physical Medicine Rehab    Follow-up: Return for Left L4 transforaminal epidural steroid injection.   Procedures: No procedures performed      Clinical History: MRI LUMBAR SPINE WITHOUT CONTRAST   TECHNIQUE: Multiplanar, multisequence MR imaging of the lumbar spine was performed. No intravenous contrast was administered.  COMPARISON:  None Available.   FINDINGS: Segmentation:  Standard.   Alignment:  Physiologic.   Vertebrae: No acute fracture, evidence of discitis, or aggressive bone lesion.   Conus medullaris and cauda equina: Conus extends to the L1 level. Conus and cauda equina appear  normal.   Paraspinal and other soft tissues: No acute paraspinal abnormality.   Disc levels:   Disc spaces: Degenerative disease with disc height loss at L4-5 and L5-S1 with reactive endplate edema. Mild disc height loss at L3-4.   T11-12: Mild broad-based disc bulge. Mild bilateral foraminal narrowing. No spinal stenosis.   T12-L1: No significant disc bulge. Mild bilateral facet arthropathy. No foraminal or central canal stenosis.   L1-L2: Mild broad-based disc bulge. Moderate bilateral facet arthropathy. Mild spinal stenosis. Mild bilateral foraminal stenosis.   L2-L3: Mild broad-based disc bulge. Moderate bilateral facet arthropathy. Moderate spinal stenosis. Bilateral lateral recess stenosis. No foraminal stenosis.   L3-L4: Broad-based disc bulge. Moderate bilateral facet arthropathy. Moderate spinal stenosis. Mild bilateral foraminal stenosis.   L4-L5: Broad-based disc osteophyte complex. Moderate bilateral facet arthropathy. Severe spinal stenosis. Moderate bilateral foraminal stenosis.   L5-S1: Broad-based disc bulge. Moderate bilateral facet arthropathy. Mild bilateral foraminal stenosis. No spinal stenosis.   IMPRESSION: 1. 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 2. No acute osseous injury of the lumbar spine.     Electronically Signed   By: Elige Ko M.D.   On: 04/18/2023 07:32   He reports that he has quit smoking. His smoking use included cigarettes. He has never used smokeless tobacco. No results for input(s): "HGBA1C", "LABURIC" in the last 8760 hours.  Objective:  VS:  HT:    WT:   BMI:     BP:   HR: bpm  TEMP: ( )  RESP:  Physical Exam Vitals and nursing note reviewed.  HENT:     Head: Normocephalic and atraumatic.     Right Ear: External ear normal.     Left Ear: External ear normal.     Nose: Nose normal.     Mouth/Throat:     Mouth: Mucous membranes are moist.  Eyes:     Extraocular Movements: Extraocular movements  intact.  Cardiovascular:     Rate and Rhythm: Normal rate.     Pulses: Normal pulses.  Pulmonary:     Effort: Pulmonary effort is normal.  Abdominal:     General: Abdomen is flat. There is no distension.  Musculoskeletal:        General: Tenderness present.     Cervical back: Normal range of motion.     Comments: Patient is slow to rise from seated position to standing. Good lumbar range of motion. No pain noted with facet loading. 5/5 strength noted with bilateral hip flexion, knee flexion/extension, ankle dorsiflexion/plantarflexion and EHL. No clonus noted bilaterally. No pain upon palpation of greater trochanters. No pain with internal/external rotation of bilateral hips. Sensation intact bilaterally. Negative slump test bilaterally. Ambulates without aid, gait steady.     Skin:    General: Skin is warm and dry.     Capillary Refill: Capillary refill takes less than 2 seconds.  Neurological:     General: No focal deficit present.     Mental Status: He is alert and oriented to person, place, and time.  Psychiatric:        Mood and Affect: Mood normal.        Behavior: Behavior normal.     Ortho Exam  Imaging: No results found.  Past Medical/Family/Surgical/Social History: Medications & Allergies reviewed per EMR, new medications updated. Patient Active Problem List   Diagnosis Date Noted   Screening for colon cancer 10/21/2023   Colon cancer screening 08/05/2023   Past Medical History:  Diagnosis Date   Hypertension    Family History  Problem Relation Age of Onset   Diabetes Mother    Hypertension Father    Diabetes Sister    COPD Brother    Alzheimer's disease Maternal Grandmother    Cancer Paternal Grandfather    Past Surgical History:  Procedure Laterality Date   CHEST SURGERY  1969   shrapnel from [place] war   COLONOSCOPY WITH PROPOFOL N/A 08/05/2023   Procedure: COLONOSCOPY WITH PROPOFOL;  Surgeon: Wyline Mood, MD;  Location: [facility] ENDOSCOPY;  Service:  Gastroenterology;  Laterality: N/A;   COLONOSCOPY WITH PROPOFOL N/A 10/21/2023   Procedure: COLONOSCOPY WITH PROPOFOL;  Surgeon: Toney Reil, MD;  Location: [facility] ENDOSCOPY;  Service: Gastroenterology;  Laterality: N/A;   Social History   Occupational History   Not on file  Tobacco Use   Smoking status: Former    Current packs/day: 0.33    Types: Cigarettes   Smokeless tobacco: Never  Vaping Use   Vaping status: Never Used  Substance and Sexual Activity   Alcohol use: No   Drug use: No   Sexual activity: Not Currently

## 2023-11-25 NOTE — Progress Notes (Signed)
Dr. Christell Constant patient he is taking ibuprofen 800mg s and pregabalin he stated they are NOT working. He stated it is going down into his l hip into his thigh.

## 2023-12-01 ENCOUNTER — Ambulatory Visit: Payer: Self-pay | Admitting: Orthopaedic Surgery

## 2023-12-09 ENCOUNTER — Other Ambulatory Visit: Payer: Self-pay

## 2023-12-09 ENCOUNTER — Ambulatory Visit: Payer: Self-pay | Admitting: Orthopedic Surgery

## 2023-12-09 ENCOUNTER — Ambulatory Visit: Payer: Medicare HMO | Admitting: Physical Medicine and Rehabilitation

## 2023-12-09 DIAGNOSIS — M5416 Radiculopathy, lumbar region: Secondary | ICD-10-CM

## 2023-12-09 MED ORDER — METHYLPREDNISOLONE ACETATE 40 MG/ML IJ SUSP
40.0000 mg | Freq: Once | INTRAMUSCULAR | Status: AC
  Administered 2023-12-09: 40 mg

## 2023-12-09 NOTE — Progress Notes (Signed)
 Functional Pain Scale - descriptive words and definitions  Uncomfortable (3)  Pain is present but can complete all ADL's/sleep is slightly affected and passive distraction only gives marginal relief. Mild range order  Average Pain 3 Pain on his L side    +Driver, -BT, -Dye Allergies.

## 2023-12-14 DIAGNOSIS — E785 Hyperlipidemia, unspecified: Secondary | ICD-10-CM | POA: Diagnosis not present

## 2023-12-14 DIAGNOSIS — I10 Essential (primary) hypertension: Secondary | ICD-10-CM | POA: Diagnosis not present

## 2023-12-17 NOTE — Progress Notes (Signed)
 Carlos Vargas - 73 y.o. male MRN [id number]  Date of birth: Dec 13, 1950  Office Visit Note: Visit Date: 12/09/2023 PCP: Carlette Benita Area, MD Referred by: Carlette Benita Area*  Subjective: Chief Complaint  Patient presents with   Lower Back - Pain   HPI:  Carlos Vargas is a 73 y.o. male who comes in today for planned repeat Left L4-5  Lumbar Transforaminal epidural steroid injection with fluoroscopic guidance.  The patient has failed conservative care including home exercise, medications, time and activity modification.  This injection will be diagnostic and hopefully therapeutic.  Please see requesting physician notes for further details and justification. Patient received more than 50% pain relief from prior injection.   Referring: Duwaine Pouch, FNP and Dr. Ozell Ada   ROS Otherwise per HPI.  Assessment & Plan: Visit Diagnoses:    ICD-10-CM   1. Lumbar radiculopathy  M54.16 XR C-ARM NO REPORT    Epidural Steroid injection    methylPREDNISolone  acetate (DEPO-MEDROL ) injection 40 mg      Plan: No additional findings.   Meds & Orders:  Meds ordered this encounter  Medications   methylPREDNISolone  acetate (DEPO-MEDROL ) injection 40 mg    Orders Placed This Encounter  Procedures   XR C-ARM NO REPORT   Epidural Steroid injection    Follow-up: Return if symptoms worsen or fail to improve.   Procedures: No procedures performed  Lumbosacral Transforaminal Epidural Steroid Injection - Sub-Pedicular Approach with Fluoroscopic Guidance  Patient: Carlos Vargas      Date of Birth: 1951-02-12 MRN: [id number] PCP: Carlette Benita Area, MD      Visit Date: 12/09/2023   Universal Protocol:    Date/Time: 12/09/2023  Consent Given By: the patient  Position: PRONE  Additional Comments: Vital signs were monitored before and after the procedure. Patient was prepped and draped in the usual sterile fashion. The correct patient, procedure, and site was  verified.   Injection Procedure Details:   Procedure diagnoses: Lumbar radiculopathy [M54.16]    Meds Administered:  Meds ordered this encounter  Medications   methylPREDNISolone  acetate (DEPO-MEDROL ) injection 40 mg    Laterality: Left  Location/Site: L4  Needle:5.0 in., 22 ga.  Short bevel or Quincke spinal needle  Needle Placement: Transforaminal  Findings:    -Comments: Excellent flow of contrast along the nerve, nerve root and into the epidural space.  Procedure Details: After squaring off the end-plates to get a true AP view, the C-arm was positioned so that an oblique view of the foramen as noted above was visualized. The target area is just inferior to the nose of the scotty dog or sub pedicular. The soft tissues overlying this structure were infiltrated with 2-3 ml. of 1% Lidocaine  without Epinephrine.  The spinal needle was inserted toward the target using a trajectory view along the fluoroscope beam.  Under AP and lateral visualization, the needle was advanced so it did not puncture dura and was located close the 6 O'Clock position of the pedical in AP tracterory. Biplanar projections were used to confirm position. Aspiration was confirmed to be negative for CSF and/or blood. A 1-2 ml. volume of Isovue-250 was injected and flow of contrast was noted at each level. Radiographs were obtained for documentation purposes.   After attaining the desired flow of contrast documented above, a 0.5 to 1.0 ml test dose of 0.25% Marcaine was injected into each respective transforaminal space.  The patient was observed for 90 seconds post injection.  After no sensory deficits were reported, and normal  lower extremity motor function was noted,   the above injectate was administered so that equal amounts of the injectate were placed at each foramen (level) into the transforaminal epidural space.   Additional Comments:  No complications occurred Dressing: 2 x 2 sterile gauze and  Band-Aid    Post-procedure details: Patient was observed during the procedure. Post-procedure instructions were reviewed.  Patient left the clinic in stable condition.    Clinical History: MRI LUMBAR SPINE WITHOUT CONTRAST   TECHNIQUE: Multiplanar, multisequence MR imaging of the lumbar spine was performed. No intravenous contrast was administered.   COMPARISON:  None Available.   FINDINGS: Segmentation:  Standard.   Alignment:  Physiologic.   Vertebrae: No acute fracture, evidence of discitis, or aggressive bone lesion.   Conus medullaris and cauda equina: Conus extends to the L1 level. Conus and cauda equina appear normal.   Paraspinal and other soft tissues: No acute paraspinal abnormality.   Disc levels:   Disc spaces: Degenerative disease with disc height loss at L4-5 and L5-S1 with reactive endplate edema. Mild disc height loss at L3-4.   T11-12: Mild broad-based disc bulge. Mild bilateral foraminal narrowing. No spinal stenosis.   T12-L1: No significant disc bulge. Mild bilateral facet arthropathy. No foraminal or central canal stenosis.   L1-L2: Mild broad-based disc bulge. Moderate bilateral facet arthropathy. Mild spinal stenosis. Mild bilateral foraminal stenosis.   L2-L3: Mild broad-based disc bulge. Moderate bilateral facet arthropathy. Moderate spinal stenosis. Bilateral lateral recess stenosis. No foraminal stenosis.   L3-L4: Broad-based disc bulge. Moderate bilateral facet arthropathy. Moderate spinal stenosis. Mild bilateral foraminal stenosis.   L4-L5: Broad-based disc osteophyte complex. Moderate bilateral facet arthropathy. Severe spinal stenosis. Moderate bilateral foraminal stenosis.   L5-S1: Broad-based disc bulge. Moderate bilateral facet arthropathy. Mild bilateral foraminal stenosis. No spinal stenosis.   IMPRESSION: 1. 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 2. No acute osseous injury of the lumbar spine.      Electronically Signed   By: Julaine Blanch M.D.   On: 04/18/2023 07:32     Objective:  VS:  HT:    WT:   BMI:     BP:   HR: bpm  TEMP: ( )  RESP:  Physical Exam Vitals and nursing note reviewed.  Constitutional:      General: He is not in acute distress.    Appearance: Normal appearance. He is not ill-appearing.  HENT:     Head: Normocephalic and atraumatic.     Right Ear: External ear normal.     Left Ear: External ear normal.     Nose: No congestion.  Eyes:     Extraocular Movements: Extraocular movements intact.  Cardiovascular:     Rate and Rhythm: Normal rate.     Pulses: Normal pulses.  Pulmonary:     Effort: Pulmonary effort is normal. No respiratory distress.  Abdominal:     General: There is no distension.     Palpations: Abdomen is soft.  Musculoskeletal:        General: No tenderness or signs of injury.     Cervical back: Neck supple.     Right lower leg: No edema.     Left lower leg: No edema.     Comments: Patient has good distal strength without clonus.  Skin:    Findings: No erythema or rash.  Neurological:     General: No focal deficit present.     Mental Status: He is alert and oriented to person, place, and time.  Sensory: No sensory deficit.     Motor: No weakness or abnormal muscle tone.     Coordination: Coordination normal.  Psychiatric:        Mood and Affect: Mood normal.        Behavior: Behavior normal.      Imaging: No results found.

## 2023-12-17 NOTE — Procedures (Signed)
 Lumbosacral Transforaminal Epidural Steroid Injection - Sub-Pedicular Approach with Fluoroscopic Guidance  Patient: Carlos Vargas      Date of Birth: 08/19/1951 MRN: [id number] PCP: Carlette Benita Area, MD      Visit Date: 12/09/2023   Universal Protocol:    Date/Time: 12/09/2023  Consent Given By: the patient  Position: PRONE  Additional Comments: Vital signs were monitored before and after the procedure. Patient was prepped and draped in the usual sterile fashion. The correct patient, procedure, and site was verified.   Injection Procedure Details:   Procedure diagnoses: Lumbar radiculopathy [M54.16]    Meds Administered:  Meds ordered this encounter  Medications   methylPREDNISolone  acetate (DEPO-MEDROL ) injection 40 mg    Laterality: Left  Location/Site: L4  Needle:5.0 in., 22 ga.  Short bevel or Quincke spinal needle  Needle Placement: Transforaminal  Findings:    -Comments: Excellent flow of contrast along the nerve, nerve root and into the epidural space.  Procedure Details: After squaring off the end-plates to get a true AP view, the C-arm was positioned so that an oblique view of the foramen as noted above was visualized. The target area is just inferior to the nose of the scotty dog or sub pedicular. The soft tissues overlying this structure were infiltrated with 2-3 ml. of 1% Lidocaine  without Epinephrine.  The spinal needle was inserted toward the target using a trajectory view along the fluoroscope beam.  Under AP and lateral visualization, the needle was advanced so it did not puncture dura and was located close the 6 O'Clock position of the pedical in AP tracterory. Biplanar projections were used to confirm position. Aspiration was confirmed to be negative for CSF and/or blood. A 1-2 ml. volume of Isovue-250 was injected and flow of contrast was noted at each level. Radiographs were obtained for documentation purposes.   After attaining the desired  flow of contrast documented above, a 0.5 to 1.0 ml test dose of 0.25% Marcaine was injected into each respective transforaminal space.  The patient was observed for 90 seconds post injection.  After no sensory deficits were reported, and normal lower extremity motor function was noted,   the above injectate was administered so that equal amounts of the injectate were placed at each foramen (level) into the transforaminal epidural space.   Additional Comments:  No complications occurred Dressing: 2 x 2 sterile gauze and Band-Aid    Post-procedure details: Patient was observed during the procedure. Post-procedure instructions were reviewed.  Patient left the clinic in stable condition.

## 2024-01-20 DIAGNOSIS — E785 Hyperlipidemia, unspecified: Secondary | ICD-10-CM | POA: Diagnosis not present

## 2024-01-20 DIAGNOSIS — Z0001 Encounter for general adult medical examination with abnormal findings: Secondary | ICD-10-CM | POA: Diagnosis not present

## 2024-01-20 DIAGNOSIS — K219 Gastro-esophageal reflux disease without esophagitis: Secondary | ICD-10-CM | POA: Diagnosis not present

## 2024-01-20 DIAGNOSIS — R7303 Prediabetes: Secondary | ICD-10-CM | POA: Diagnosis not present

## 2024-01-20 DIAGNOSIS — I10 Essential (primary) hypertension: Secondary | ICD-10-CM | POA: Diagnosis not present

## 2024-01-20 DIAGNOSIS — M545 Low back pain, unspecified: Secondary | ICD-10-CM | POA: Diagnosis not present

## 2024-01-20 DIAGNOSIS — Z1389 Encounter for screening for other disorder: Secondary | ICD-10-CM | POA: Diagnosis not present

## 2024-01-20 DIAGNOSIS — Z1331 Encounter for screening for depression: Secondary | ICD-10-CM | POA: Diagnosis not present

## 2024-02-20 DIAGNOSIS — I10 Essential (primary) hypertension: Secondary | ICD-10-CM | POA: Diagnosis not present

## 2024-02-20 DIAGNOSIS — E785 Hyperlipidemia, unspecified: Secondary | ICD-10-CM | POA: Diagnosis not present

## 2024-03-21 DIAGNOSIS — E785 Hyperlipidemia, unspecified: Secondary | ICD-10-CM | POA: Diagnosis not present

## 2024-03-21 DIAGNOSIS — I10 Essential (primary) hypertension: Secondary | ICD-10-CM | POA: Diagnosis not present

## 2024-04-08 ENCOUNTER — Emergency Department (HOSPITAL_COMMUNITY)

## 2024-04-08 ENCOUNTER — Emergency Department (HOSPITAL_COMMUNITY)
Admission: EM | Admit: 2024-04-08 | Discharge: 2024-04-08 | Disposition: A | Discharge status: Home / Self Care | Attending: Emergency Medicine | Admitting: Emergency Medicine

## 2024-04-08 ENCOUNTER — Encounter (HOSPITAL_COMMUNITY): Payer: Self-pay | Admitting: *Deleted

## 2024-04-08 ENCOUNTER — Other Ambulatory Visit: Payer: Self-pay

## 2024-04-08 DIAGNOSIS — I6782 Cerebral ischemia: Secondary | ICD-10-CM | POA: Diagnosis not present

## 2024-04-08 DIAGNOSIS — R200 Anesthesia of skin: Secondary | ICD-10-CM | POA: Diagnosis not present

## 2024-04-08 DIAGNOSIS — M5412 Radiculopathy, cervical region: Secondary | ICD-10-CM | POA: Insufficient documentation

## 2024-04-08 DIAGNOSIS — I6521 Occlusion and stenosis of right carotid artery: Secondary | ICD-10-CM | POA: Diagnosis not present

## 2024-04-08 DIAGNOSIS — M4802 Spinal stenosis, cervical region: Secondary | ICD-10-CM | POA: Diagnosis not present

## 2024-04-08 DIAGNOSIS — I771 Stricture of artery: Secondary | ICD-10-CM | POA: Diagnosis not present

## 2024-04-08 DIAGNOSIS — M4804 Spinal stenosis, thoracic region: Secondary | ICD-10-CM | POA: Diagnosis not present

## 2024-04-08 DIAGNOSIS — I10 Essential (primary) hypertension: Secondary | ICD-10-CM | POA: Diagnosis not present

## 2024-04-08 DIAGNOSIS — M5011 Cervical disc disorder with radiculopathy,  high cervical region: Secondary | ICD-10-CM | POA: Diagnosis not present

## 2024-04-08 DIAGNOSIS — Z8673 Personal history of transient ischemic attack (TIA), and cerebral infarction without residual deficits: Secondary | ICD-10-CM | POA: Diagnosis not present

## 2024-04-08 LAB — COMPREHENSIVE METABOLIC PANEL WITH GFR
ALT: 18 U/L (ref 0–44)
AST: 24 U/L (ref 15–41)
Albumin: 3.2 g/dL — ABNORMAL LOW (ref 3.5–5.0)
Alkaline Phosphatase: 64 U/L (ref 38–126)
Anion gap: 8 (ref 5–15)
BUN: 17 mg/dL (ref 8–23)
CO2: 26 mmol/L (ref 22–32)
Calcium: 8.5 mg/dL — ABNORMAL LOW (ref 8.9–10.3)
Chloride: 104 mmol/L (ref 98–111)
Creatinine, Ser: 0.98 mg/dL (ref 0.61–1.24)
GFR, Estimated: >60 mL/min
Glucose, Bld: 113 mg/dL — ABNORMAL HIGH (ref 70–99)
Potassium: 4.1 mmol/L (ref 3.5–5.1)
Sodium: 138 mmol/L (ref 135–145)
Total Bilirubin: 0.4 mg/dL (ref 0.0–1.2)
Total Protein: 6.8 g/dL (ref 6.5–8.1)

## 2024-04-08 LAB — CBC WITH DIFFERENTIAL/PLATELET
Abs Immature Granulocytes: 0.01 10*3/uL (ref 0.00–0.07)
Basophils Absolute: 0 10*3/uL (ref 0.0–0.1)
Basophils Relative: 1 %
Eosinophils Absolute: 0.1 10*3/uL (ref 0.0–0.5)
Eosinophils Relative: 3 %
HCT: 38.6 % — ABNORMAL LOW (ref 39.0–52.0)
Hemoglobin: 12.6 g/dL — ABNORMAL LOW (ref 13.0–17.0)
Immature Granulocytes: 0 %
Lymphocytes Relative: 37 %
Lymphs Abs: 1.7 10*3/uL (ref 0.7–4.0)
MCH: 27.7 pg (ref 26.0–34.0)
MCHC: 32.6 g/dL (ref 30.0–36.0)
MCV: 84.8 fL (ref 80.0–100.0)
Monocytes Absolute: 0.6 10*3/uL (ref 0.1–1.0)
Monocytes Relative: 13 %
Neutro Abs: 2.1 10*3/uL (ref 1.7–7.7)
Neutrophils Relative %: 46 %
Platelets: 272 10*3/uL (ref 150–400)
RBC: 4.55 MIL/uL (ref 4.22–5.81)
RDW: 15.3 % (ref 11.5–15.5)
WBC: 4.6 10*3/uL (ref 4.0–10.5)
nRBC: 0 % (ref 0.0–0.2)

## 2024-04-08 LAB — MAGNESIUM: Magnesium: 1.8 mg/dL (ref 1.7–2.4)

## 2024-04-08 LAB — TROPONIN I (HIGH SENSITIVITY): Troponin I (High Sensitivity): 4 ng/L

## 2024-04-08 MED ORDER — METHYLPREDNISOLONE 4 MG PO TBPK: ORAL_TABLET | ORAL | 0 refills | Status: AC

## 2024-04-08 MED ORDER — IOHEXOL 350 MG/ML SOLN
75.0000 mL | Freq: Once | INTRAVENOUS | Status: AC | PRN
  Administered 2024-04-08: 75 mL via INTRAVENOUS

## 2024-04-08 NOTE — Discharge Instructions (Addendum)
 [facility] follow-up closely with neurosurgery on an outpatient basis.  Return to emergency department immediately for any new or worsening symptoms.

## 2024-04-08 NOTE — ED Triage Notes (Signed)
 Pt c/o left face and arm numbness that started at 7am today  Pt denies any headache, dizziness or blurred vision  Pt states he has a hx of previous stroke with no deficits

## 2024-04-08 NOTE — ED Provider Notes (Signed)
 [place] EMERGENCY DEPARTMENT AT [facility] Provider Note   CSN: [id number] Arrival date & time: 04/08/24  1309     History  Chief Complaint  Patient presents with   Numbness    Carlos Vargas is a 73 y.o. male.  Is a 73 year old male who presents to the emergency department with a chief complaint of numbness to the left side of the face and left arm.  He notes this started approximately 7 AM this morning.  Patient denies any numbness or tingling to his lower extremities.  Patient notes that yesterday he was experiencing numbness and paresthesias to bilateral extremities.  He denies any animal headaches, pain to neck or back.  He denies any recent falls or blunt head or neck trauma.  He denies any associated chest pain, shortness of breath, abdominal pain, nausea, vomiting, diarrhea.  He denies any associated dizziness, lightheadedness or syncope.        Home Medications Prior to Admission medications   Medication Sig Start Date End Date Taking? Authorizing Provider  nystatin-triamcinolone (MYCOLOG II) cream Apply 1 Application topically 3 (three) times daily. 10/14/23  Yes [provider]  omeprazole (PRILOSEC) 20 MG capsule Take 20 mg by mouth every morning. 01/20/24  Yes [provider]  amLODipine (NORVASC) 2.5 MG tablet Take 2.5 mg by mouth every morning. 12/31/22   [provider]  atorvastatin (LIPITOR) 20 MG tablet Take 20 mg by mouth at bedtime. 01/15/23   [provider]  cyclobenzaprine  (FLEXERIL ) 10 MG tablet Take 0.5-1 tablets (5-10 mg total) by mouth 3 (three) times daily as needed. 11/23/23   Angelia Kelp, PA-C  ibuprofen (ADVIL) 800 MG tablet Take 800 mg by mouth every 8 (eight) hours as needed for mild pain (pain score 1-3). 02/03/23   [provider]  naproxen  (NAPROSYN ) 500 MG tablet Take 1 tablet (500 mg total) by mouth 2 (two) times daily with a meal. 11/23/23   Burnette, Leory Rands, PA-C  pregabalin   (LYRICA ) 75 MG capsule Take 1 capsule (75 mg total) by mouth 2 (two) times daily. 10/29/23 01/27/24  Diedra Fowler, MD  tadalafil (CIALIS) 5 MG tablet Take 5 mg by mouth daily as needed. 05/21/23   [provider]      Allergies    Patient has no known allergies.    Review of Systems   Review of Systems  Neurological:  Positive for numbness.    Physical Exam Updated Vital Signs BP (!) 147/95 (BP Location: Left Arm)   Pulse 92   Temp 97.9 F (36.6 C) (Oral)   Resp 20   SpO2 96%  Physical Exam Vitals and nursing note reviewed.  Constitutional:      Appearance: Normal appearance.  HENT:     Head: Normocephalic and atraumatic.     Nose: Nose normal.     Mouth/Throat:     Mouth: Mucous membranes are moist.  Eyes:     Extraocular Movements: Extraocular movements intact.     Conjunctiva/sclera: Conjunctivae normal.     Pupils: Pupils are equal, round, and reactive to light.  Cardiovascular:     Rate and Rhythm: Normal rate and regular rhythm.     Pulses: Normal pulses.     Heart sounds: Normal heart sounds. No murmur heard.    No gallop.  Pulmonary:     Effort: Pulmonary effort is normal. No respiratory distress.     Breath sounds: Normal breath sounds. No stridor. No wheezing, rhonchi or rales.  Abdominal:     General: Abdomen is flat. Bowel sounds are normal. There is no distension.     Palpations: Abdomen is soft.     Tenderness: There is no abdominal tenderness. There is no guarding.  Musculoskeletal:        General: No swelling, tenderness, deformity or signs of injury. Normal range of motion.     Cervical back: Normal range of motion and neck supple.  Skin:    General: Skin is warm and dry.     Findings: No rash.  Neurological:     General: No focal deficit present.     Mental Status: He is alert and oriented to person, place, and time. Mental status is at baseline.     Cranial Nerves: No cranial nerve deficit.     Motor: No weakness.     Coordination:  Coordination normal.     Gait: Gait normal.     Comments: Mild decrease sensation to the left upper extremity, sensation intact to remaining upper and lower extremities, strength 5 out of 5 bilaterally in upper and lower extremities, no pronator drift, normal finger-to-nose, normal heel-to-shin  Psychiatric:        Mood and Affect: Mood normal.        Behavior: Behavior normal.        Thought Content: Thought content normal.        Judgment: Judgment normal.     ED Results / Procedures / Treatments   Labs (all labs ordered are listed, but only abnormal results are displayed) Labs Reviewed  COMPREHENSIVE METABOLIC PANEL WITH GFR - Abnormal; Notable for the following components:      Result Value   Glucose, Bld 113 (*)    Calcium 8.5 (*)    Albumin 3.2 (*)    All other components within normal limits  CBC WITH DIFFERENTIAL/PLATELET - Abnormal; Notable for the following components:   Hemoglobin 12.6 (*)    HCT 38.6 (*)    All other components within normal limits  MAGNESIUM  TROPONIN I (HIGH SENSITIVITY)    EKG None  Radiology CT Angio Head Neck W WO CM Result Date: 04/08/2024 CLINICAL DATA:  Left-sided numbness, left face and arm numbness starting this morning. EXAM: CT ANGIOGRAPHY HEAD AND NECK WITH AND WITHOUT CONTRAST TECHNIQUE: Multidetector CT imaging of the head and neck was performed using the standard protocol during bolus administration of intravenous contrast. Multiplanar CT image reconstructions and MIPs were obtained to evaluate the vascular anatomy. Carotid stenosis measurements (when applicable) are obtained utilizing NASCET criteria, using the distal internal carotid diameter as the denominator. RADIATION DOSE REDUCTION: This exam was performed according to the departmental dose-optimization program which includes automated exposure control, adjustment of the mA and/or kV according to patient size and/or use of iterative reconstruction technique. CONTRAST:  75mL  OMNIPAQUE  IOHEXOL  350 MG/ML SOLN COMPARISON:  Same day MRI head. FINDINGS: CT HEAD FINDINGS Brain: No acute intracranial hemorrhage. No CT evidence of acute infarct. Nonspecific hypoattenuation in the periventricular and subcortical white matter favored to reflect chronic microvascular ischemic changes. No edema, mass effect, or midline shift. The basilar cisterns are patent. Ventricles: Ventricles are normal in size and configuration. Vascular: No hyperdense vessel. Skull: No acute or aggressive finding. Chronic appearing deformity of the right lamina papyracea. Sinuses/orbits: Orbits are symmetric. Mild mucosal thickening in the visualized ethmoid sinuses. Other: Mastoid air cells are clear. CTA NECK FINDINGS Aortic arch: Standard configuration of the aortic arch. Imaged portion shows no evidence of aneurysm or  dissection. No significant stenosis of the major arch vessel origins. Pulmonary arteries: Limited visualization of the upper lobe pulmonary arteries due to contrast timing. Subclavian arteries: The subclavian arteries are patent bilaterally. Right carotid system: Patent from the origin to the skull base. Calcified and noncalcified atherosclerotic plaque at the carotid bifurcation. Noncalcified atherosclerotic plaque extending into the proximal cervical ICA. Up to 50% stenosis at the origin of the right cervical ICA. Mild tortuosity of the mid and distal cervical ICA. There is mild ectasia of the vessel just proximal to the skull base without frank aneurysmal dilatation. Left carotid system: Patent from the origin to the skull base. There is mild noncalcified atherosclerosis along the anterior aspect of the distal common carotid artery. Minimal atherosclerosis at the carotid bifurcation without hemodynamically significant stenosis. Mild tortuosity of the mid cervical ICA. Vertebral arteries: Codominant. No evidence of dissection, stenosis (50% or greater), or occlusion. Skeleton: No acute findings.  Degenerative changes in the cervical spine. Other neck: The visualized airway is patent. No cervical lymphadenopathy. Upper chest: Emphysema in the visualized lung apices. Review of the MIP images confirms the above findings CTA HEAD FINDINGS ANTERIOR CIRCULATION: The intracranial ICAs are patent bilaterally. No significant stenosis, proximal occlusion, aneurysm, or vascular malformation. MCAs: The middle cerebral arteries are patent bilaterally. ACAs: The anterior cerebral arteries are patent bilaterally. POSTERIOR CIRCULATION: No significant stenosis, proximal occlusion, aneurysm, or vascular malformation. PCAs: The posterior cerebral arteries are patent bilaterally. Pcomm: The posterior communicating arteries are visualized bilaterally. SCAs: The superior cerebellar arteries are patent bilaterally. Basilar artery: Patent AICAs: Visualized on the right. PICAs: Visualized on the left. Vertebral arteries: The intracranial vertebral arteries are patent. Venous sinuses: As permitted by contrast timing, patent. Anatomic variants: None Review of the MIP images confirms the above findings IMPRESSION: No large vessel occlusion. No CT evidence of acute intracranial abnormality. Calcified and noncalcified atherosclerotic plaque at the right carotid bifurcation resulting in 50% stenosis at the origin of the right cervical ICA. No high-grade stenosis of the intracranial arterial vasculature. Mild ectasia of the distal right cervical ICA just proximal to the skull base without evidence of aneurysmal dilatation. 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 Electronically Signed   By: Denny Flack M.D.   On: 04/08/2024 17:07   MR Cervical Spine Wo Contrast Result Date: 04/08/2024 CLINICAL DATA:  Cervical radiculopathy. Left face and arm numbness starting today. EXAM: MRI CERVICAL SPINE WITHOUT CONTRAST TECHNIQUE: Multiplanar, multisequence MR imaging of the cervical spine was performed. No intravenous contrast was administered. COMPARISON:   None Available. FINDINGS: Alignment: 2 mm retrolisthesis of C2 on C3, 2 mm retrolisthesis of C3 on C4, 2 mm retrolisthesis of C4 on C5, 2 mm retrolisthesis of C5 on C6, and 2 mm retrolisthesis of C6 on C7. Vertebrae: Vertebral body heights are maintained. The atlantodens interval is intact. Mild anterior C2-3, moderate posterior C3-4, moderate to severe diffuse C4-5, moderate to severe posterior C5-6 and C6-7, moderate diffuse T1-2, mild anterior T2-3, and mild anterior T3-4 disc space narrowing. There is moderate C6-7, T1-T2, and T3-T4 chronic fat intensity marrow endplate degenerative change. There is moderate to high-grade diffuse left sided C7 and inferior right C7 and left greater than right superior T1 marrow edema (sagittal series 7 images 6 through 12). This marrow edema also extends into the left T1 pedicle (sagittal series 7, image 14). Th The high-grade marrow edema is of the intensity that can be seen with an acute fracture, and question a tiny borderline fracture within the left C7 vertebral  body (sagittal image 11 and axial image 36). Moderate posterior left C6-7, mild posterior C3-4, and moderate right greater left T4-T5 edematous marrow endplate degenerative changes. Cord: Normal signal and caliber of the visualized cervical cord. Posterior Fossa, vertebral arteries, paraspinal tissues: Negative. Disc levels: C2-3: Moderate left and mild right facet joint hypertrophy. Mild-to-moderate left greater than right posterior disc osteophyte complex with left-greater-than-right intraforaminal extension. Moderate to severe left and mild-to-moderate right neuroforaminal stenosis. Mild central canal narrowing without true stenosis. C3-4: Moderate bilateral facet joint hypertrophy. Moderate broad-based posterior disc osteophyte complex with left-greater-than-right intraforaminal extension. Severe left and moderate to severe right neuroforaminal stenosis. Mild left and moderate right lateral recess stenosis. Mild  central canal stenosis. C4-5: Moderate to severe bilateral facet joint hypertrophy. Moderate right-greater-than-left posterior disc osteophyte complex with bilateral intraforaminal extension. Severe right and moderate left neuroforaminal stenosis. Mild central canal narrowing without true stenosis. C5-6: Moderate bilateral facet joint hypertrophy. Moderate broad-based posterior disc osteophyte complex contacts the ventral cord. Moderate central canal stenosis. Bilateral intraforaminal disc osteophyte extension with severe right-greater-than-left neuroforaminal stenosis. C6-7: Moderate bilateral facet joint hypertrophy. Mild-to-moderate broad-based posterior disc osteophyte complex. Mild central canal stenosis. Severe bilateral neuroforaminal stenosis. C7-T1: Mild-to-moderate broad-based posterior disc bulge. Severe left and moderate right facet joint hypertrophy. No central canal stenosis. Severe left and moderate right neuroforaminal stenosis. T1-T2: Seen on sagittal images only. Facet joint hypertrophy and broad-based posterior disc osteophyte complex contribute to severe left and moderate to severe right neuroforaminal stenosis. T2-3: Seen on sagittal images only. Facet joint hypertrophy, mild broad-based posterior disc osteophyte complex, and intraforaminal endplate spurring contribute to moderate to severe bilateral neuroforaminal stenosis. T3-4: Seen on sagittal images only. Facet joint hypertrophy and mild broad-based posterior disc bulge contribute to moderate bilateral neuroforaminal stenosis. T4-5: Seen on sagittal images only. Facet joint hypertrophy contributes to moderate left neuroforaminal stenosis. IMPRESSION: 1. Moderate to high-grade diffuse left sided C7 and inferior right C7 and left greater than right superior T1 marrow edema. This marrow edema also extends into the left T1 pedicle. The high-grade marrow edema is of the intensity that can be seen with an acute fracture, and question a tiny  borderline fracture within the left C7 vertebral body. 2. Multilevel degenerative disc and joint changes as above. 3. Moderate to severe left C2-3, severe left and moderate to severe right C3-4, severe right and moderate left C4-5, severe bilateral C5-6, severe bilateral C6-7, severe left and moderate right C7-T1, severe left and moderate to severe right T1-T2, moderate to severe bilateral T2-3, moderate bilateral T3-4, and moderate left T4-5 neuroforaminal stenosis. Electronically Signed   By: Bertina Broccoli M.D.   On: 04/08/2024 15:25   MR Brain Wo Contrast (neuro protocol) Result Date: 04/08/2024 CLINICAL DATA:  Left-sided numbness, numbness of left face and left arm starting this morning. EXAM: MRI HEAD WITHOUT CONTRAST TECHNIQUE: Multiplanar, multiecho pulse sequences of the brain and surrounding structures were obtained without intravenous contrast. COMPARISON:  None Available. FINDINGS: Brain: No acute infarct. No evidence of intracranial hemorrhage. T2/FLAIR hyperintensity in the periventricular and subcortical white matter with additional signal abnormality in the pons likely related to chronic microvascular ischemic changes. Multiple prominent perivascular spaces involving the basal ganglia and periventricular white matter. Likely additional component of remote lacunar infarct involving the right frontal periventricular white matter. No edema, mass effect, or midline shift. Posterior fossa is unremarkable. Normal appearance of midline structures. The basilar cisterns are patent. No extra-axial fluid collections. Ventricles: Normal size and configuration of the ventricles. Vascular: Skull  base flow voids are visualized. Chronic appearing deformity of the right lamina papyracea. Skull and upper cervical spine: No focal abnormality. Sinuses/Orbits: Orbits are symmetric. Mild mucosal thickening in the bilateral ethmoid sinuses. Mucosal thickening in the alveolar recesses of both maxillary sinuses. Other:  Mastoid air cells are clear. IMPRESSION: No acute intracranial abnormality. 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 Electronically Signed   By: Denny Flack M.D.   On: 04/08/2024 15:24    Procedures Procedures    Medications Ordered in ED Medications  iohexol  (OMNIPAQUE ) 350 MG/ML injection 75 mL (75 mLs Intravenous Contrast Given 04/08/24 1609)    ED Course/ Medical Decision Making/ A&P                                 Medical Decision Making Amount and/or Complexity of Data Reviewed Labs: ordered. Radiology: ordered.  Risk Prescription drug management.   This patient presents to the ED for concern of numbness to the left upper extremity differential diagnosis includes CVA, TIA, radiculopathy, electrolyte derangement, kidney injury    Additional history obtained:  Additional history obtained from none External records from outside source obtained and reviewed including none   Lab Tests:  I Ordered, and personally interpreted labs.  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  Discussed with patient symptoms most consistent with cervical radiculopathy.  Did discuss MRI cervical findings with neurosurgery, Dr. Sabra Cramp, who notes that patient can follow-up with neurosurgery on an outpatient basis.  He has had no recent falls or blunt trauma to his head, neck or back.  Do not suspect cervical fracture at this point.  He has full range of motion without difficulty.  Numbness has completely resolved in the emergency department.  Will treat with a short course of an oral steroid.  Did discuss the importance of close follow-up with neurosurgery on an outpatient basis.  He has no indication for CVA or TIA.  He was made aware of the narrowing of the carotid arteries.  Strict turn precautions were discussed for any new or worsening symptoms.  Patient voiced understanding and had no additional questions.   Social Determinants of Health:  None           Final Clinical Impression(s) / ED Diagnoses Final diagnoses:  None    Rx / DC Orders ED Discharge Orders     None         Emmalene Hare 04/08/24 1725    Ninetta Basket, MD 04/13/24 1225

## 2024-04-21 DIAGNOSIS — E785 Hyperlipidemia, unspecified: Secondary | ICD-10-CM | POA: Diagnosis not present

## 2024-04-21 DIAGNOSIS — I10 Essential (primary) hypertension: Secondary | ICD-10-CM | POA: Diagnosis not present

## 2024-05-21 DIAGNOSIS — I10 Essential (primary) hypertension: Secondary | ICD-10-CM | POA: Diagnosis not present

## 2024-05-21 DIAGNOSIS — E785 Hyperlipidemia, unspecified: Secondary | ICD-10-CM | POA: Diagnosis not present

## 2024-06-15 ENCOUNTER — Other Ambulatory Visit (INDEPENDENT_AMBULATORY_CARE_PROVIDER_SITE_OTHER): Payer: Self-pay

## 2024-06-15 ENCOUNTER — Telehealth: Payer: Self-pay | Admitting: Orthopedic Surgery

## 2024-06-15 ENCOUNTER — Other Ambulatory Visit: Payer: Self-pay | Admitting: Orthopedic Surgery

## 2024-06-15 ENCOUNTER — Ambulatory Visit: Admitting: Orthopedic Surgery

## 2024-06-15 DIAGNOSIS — M545 Low back pain, unspecified: Secondary | ICD-10-CM | POA: Diagnosis not present

## 2024-06-15 MED ORDER — PREGABALIN 75 MG PO CAPS
75.0000 mg | ORAL_CAPSULE | Freq: Two times a day (BID) | ORAL | 2 refills | Status: AC
Start: 2024-06-15 — End: 2024-09-13

## 2024-06-15 MED ORDER — TRAMADOL HCL 50 MG PO TABS: 50.0000 mg | ORAL_TABLET | Freq: Four times a day (QID) | ORAL | 0 refills | Status: AC | PRN

## 2024-06-15 NOTE — Telephone Encounter (Signed)
 Patient was here. Would like to know if he could get Lyrica  called in for him as well?

## 2024-06-15 NOTE — Progress Notes (Signed)
 Orthopedic Spine Surgery Office Note   Assessment: Patient is a 73 y.o. male with low back pain that radiates into the bilateral buttock and left posterior proximal thigh. 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 Told him I would not refill the tramadol  -Referral given to pain management and Dr. Eldonna -Patient reiterated to me today that he is not interested in surgery and that is not an option for him.  I told him if that his pain gets bad enough that he is requiring narcotics, I will refer him to pain management -Patient should return to office on an as needed basis     Patient expressed understanding of the plan and all questions were answered to the patient's satisfaction.    ___________________________________________________________________________     History:   Patient is a 73 y.o. male who presents today for follow up on his lumbar spine.  Patient continues to have significant low back pain.  He also has pain radiating to his bilateral buttock and his left posterior thigh.  His pain is largely unchanged since he was last seen in the office.  He never established with pain management.  He has pain on a daily basis.  The majority of his pain is in the low back.  He has not developed any new symptoms since he was last seen in the office.  He noticed improvement in his symptoms with lumbar injections.  His last 1 gave him about 80% relief for 3 months.  He currently rates his pain as an 8 out of 10.     Physical Exam:   General: no acute distress, appears stated age Neurologic: alert, answering questions appropriately, following commands Respiratory: unlabored breathing on room air, symmetric chest rise Psychiatric: appropriate affect, normal cadence to speech     MSK (spine):   -Strength exam                                                    Left                  Right EHL                              5/5                  5/5 TA                                 5/5                  5/5 GSC                             5/5                  5/5 Knee extension            5/5                  5/5 Hip flexion  5/5                  5/5   -Sensory exam                           Sensation intact to light touch in L3-S1 nerve distributions of bilateral lower extremities     Imaging: XRs of the lumbar spine from 06/15/2024 were independently reviewed and interpreted, showing disc height loss at L3/4 and L5/S1.  There is a bridging syndesmophyte on the right at L3/4.  No fracture or dislocation seen.  No evidence of instability on flexion/extension views.   MRI of the lumbar from 04/08/2023 was previously independently reviewed and interpreted, showing L4/5 central and lateral recess stenosis. Has bilateral foraminal stenosis at L5/S1,      Patient name: Carlos Vargas Patient MRN: [id number] Date of visit: 06/15/24

## 2024-06-21 DIAGNOSIS — I10 Essential (primary) hypertension: Secondary | ICD-10-CM | POA: Diagnosis not present

## 2024-06-21 DIAGNOSIS — E785 Hyperlipidemia, unspecified: Secondary | ICD-10-CM | POA: Diagnosis not present

## 2024-06-28 ENCOUNTER — Ambulatory Visit: Admitting: Physical Medicine and Rehabilitation

## 2024-06-28 ENCOUNTER — Other Ambulatory Visit: Payer: Self-pay

## 2024-06-28 VITALS — BP 156/89 | HR 73

## 2024-06-28 DIAGNOSIS — M5416 Radiculopathy, lumbar region: Secondary | ICD-10-CM | POA: Diagnosis not present

## 2024-06-28 MED ORDER — METHYLPREDNISOLONE ACETATE 40 MG/ML IJ SUSP
40.0000 mg | Freq: Once | INTRAMUSCULAR | Status: AC
  Administered 2024-06-28: 40 mg

## 2024-06-28 NOTE — Progress Notes (Signed)
 Carlos Vargas - 73 y.o. male MRN [id number]  Date of birth: 29-May-1951  Office Visit Note: Visit Date: 06/28/2024 PCP: Carlette Benita Area, MD Referred by: Georgina Ozell LABOR, MD  Subjective: Chief Complaint  Patient presents with   Lower Back - Pain   HPI:  Carlos Vargas is a 73 y.o. male who comes in today at the request of Dr. Ozell Georgina for planned Left L4-5 Lumbar Transforaminal epidural steroid injection with fluoroscopic guidance.  The patient has failed conservative care including home exercise, medications, time and activity modification.  This injection will be diagnostic and hopefully therapeutic.  Please see requesting physician notes for further details and justification.   ROS Otherwise per HPI.  Assessment & Plan: Visit Diagnoses:    ICD-10-CM   1. Lumbar radiculopathy  M54.16 XR C-ARM NO REPORT    Epidural Steroid injection    methylPREDNISolone  acetate (DEPO-MEDROL ) injection 40 mg      Plan: No additional findings.   Meds & Orders:  Meds ordered this encounter  Medications   methylPREDNISolone  acetate (DEPO-MEDROL ) injection 40 mg    Orders Placed This Encounter  Procedures   XR C-ARM NO REPORT   Epidural Steroid injection    Follow-up: Return for visit to requesting provider as needed.   Procedures: No procedures performed  Lumbosacral Transforaminal Epidural Steroid Injection - Sub-Pedicular Approach with Fluoroscopic Guidance  Patient: Carlos Vargas      Date of Birth: 1951/08/26 MRN: [id number] PCP: Carlette Benita Area, MD      Visit Date: 06/28/2024   Universal Protocol:    Date/Time: 06/28/2024  Consent Given By: the patient  Position: PRONE  Additional Comments: Vital signs were monitored before and after the procedure. Patient was prepped and draped in the usual sterile fashion. The correct patient, procedure, and site was verified.   Injection Procedure Details:   Procedure diagnoses: Lumbar radiculopathy [M54.16]     Meds Administered:  Meds ordered this encounter  Medications   methylPREDNISolone  acetate (DEPO-MEDROL ) injection 40 mg    Laterality: Left  Location/Site: L4  Needle:5.0 in., 22 ga.  Short bevel or Quincke spinal needle  Needle Placement: Transforaminal  Findings:    -Comments: Excellent flow of contrast along the nerve, nerve root and into the epidural space.  Procedure Details: After squaring off the end-plates to get a true AP view, the C-arm was positioned so that an oblique view of the foramen as noted above was visualized. The target area is just inferior to the nose of the scotty dog or sub pedicular. The soft tissues overlying this structure were infiltrated with 2-3 ml. of 1% Lidocaine  without Epinephrine.  The spinal needle was inserted toward the target using a trajectory view along the fluoroscope beam.  Under AP and lateral visualization, the needle was advanced so it did not puncture dura and was located close the 6 O'Clock position of the pedical in AP tracterory. Biplanar projections were used to confirm position. Aspiration was confirmed to be negative for CSF and/or blood. A 1-2 ml. volume of Isovue-250 was injected and flow of contrast was noted at each level. Radiographs were obtained for documentation purposes.   After attaining the desired flow of contrast documented above, a 0.5 to 1.0 ml test dose of 0.25% Marcaine was injected into each respective transforaminal space.  The patient was observed for 90 seconds post injection.  After no sensory deficits were reported, and normal lower extremity motor function was noted,   the above injectate was administered so  that equal amounts of the injectate were placed at each foramen (level) into the transforaminal epidural space.   Additional Comments:  The patient tolerated the procedure well Dressing: 2 x 2 sterile gauze and Band-Aid    Post-procedure details: Patient was observed during the  procedure. Post-procedure instructions were reviewed.  Patient left the clinic in stable condition.    Clinical History: MRI LUMBAR SPINE WITHOUT CONTRAST   TECHNIQUE: Multiplanar, multisequence MR imaging of the lumbar spine was performed. No intravenous contrast was administered.   COMPARISON:  None Available.   FINDINGS: Segmentation:  Standard.   Alignment:  Physiologic.   Vertebrae: No acute fracture, evidence of discitis, or aggressive bone lesion.   Conus medullaris and cauda equina: Conus extends to the L1 level. Conus and cauda equina appear normal.   Paraspinal and other soft tissues: No acute paraspinal abnormality.   Disc levels:   Disc spaces: Degenerative disease with disc height loss at L4-5 and L5-S1 with reactive endplate edema. Mild disc height loss at L3-4.   T11-12: Mild broad-based disc bulge. Mild bilateral foraminal narrowing. No spinal stenosis.   T12-L1: No significant disc bulge. Mild bilateral facet arthropathy. No foraminal or central canal stenosis.   L1-L2: Mild broad-based disc bulge. Moderate bilateral facet arthropathy. Mild spinal stenosis. Mild bilateral foraminal stenosis.   L2-L3: Mild broad-based disc bulge. Moderate bilateral facet arthropathy. Moderate spinal stenosis. Bilateral lateral recess stenosis. No foraminal stenosis.   L3-L4: Broad-based disc bulge. Moderate bilateral facet arthropathy. Moderate spinal stenosis. Mild bilateral foraminal stenosis.   L4-L5: Broad-based disc osteophyte complex. Moderate bilateral facet arthropathy. Severe spinal stenosis. Moderate bilateral foraminal stenosis.   L5-S1: Broad-based disc bulge. Moderate bilateral facet arthropathy. Mild bilateral foraminal stenosis. No spinal stenosis.   IMPRESSION: 1. 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 2. No acute osseous injury of the lumbar spine.     Electronically Signed   By: Julaine Blanch M.D.   On: 04/18/2023 07:32      Objective:  VS:  HT:    WT:   BMI:     BP:(!) 156/89  HR:73bpm  TEMP: ( )  RESP:  Physical Exam Vitals and nursing note reviewed.  Constitutional:      General: He is not in acute distress.    Appearance: Normal appearance. He is not ill-appearing.  HENT:     Head: Normocephalic and atraumatic.     Right Ear: External ear normal.     Left Ear: External ear normal.     Nose: No congestion.  Eyes:     Extraocular Movements: Extraocular movements intact.  Cardiovascular:     Rate and Rhythm: Normal rate.     Pulses: Normal pulses.  Pulmonary:     Effort: Pulmonary effort is normal. No respiratory distress.  Abdominal:     General: There is no distension.     Palpations: Abdomen is soft.  Musculoskeletal:        General: No tenderness or signs of injury.     Cervical back: Neck supple.     Right lower leg: No edema.     Left lower leg: No edema.     Comments: Patient has good distal strength without clonus.  Skin:    Findings: No erythema or rash.  Neurological:     General: No focal deficit present.     Mental Status: He is alert and oriented to person, place, and time.     Sensory: No sensory deficit.     Motor:  No weakness or abnormal muscle tone.     Coordination: Coordination normal.  Psychiatric:        Mood and Affect: Mood normal.        Behavior: Behavior normal.      Imaging: No results found.

## 2024-06-28 NOTE — Procedures (Signed)
 Lumbosacral Transforaminal Epidural Steroid Injection - Sub-Pedicular Approach with Fluoroscopic Guidance  Patient: Carlos Vargas      Date of Birth: Dec 26, 1950 MRN: [id number] PCP: Carlette Benita Area, MD      Visit Date: 06/28/2024   Universal Protocol:    Date/Time: 06/28/2024  Consent Given By: the patient  Position: PRONE  Additional Comments: Vital signs were monitored before and after the procedure. Patient was prepped and draped in the usual sterile fashion. The correct patient, procedure, and site was verified.   Injection Procedure Details:   Procedure diagnoses: Lumbar radiculopathy [M54.16]    Meds Administered:  Meds ordered this encounter  Medications   methylPREDNISolone  acetate (DEPO-MEDROL ) injection 40 mg    Laterality: Left  Location/Site: L4  Needle:5.0 in., 22 ga.  Short bevel or Quincke spinal needle  Needle Placement: Transforaminal  Findings:    -Comments: Excellent flow of contrast along the nerve, nerve root and into the epidural space.  Procedure Details: After squaring off the end-plates to get a true AP view, the C-arm was positioned so that an oblique view of the foramen as noted above was visualized. The target area is just inferior to the nose of the scotty dog or sub pedicular. The soft tissues overlying this structure were infiltrated with 2-3 ml. of 1% Lidocaine  without Epinephrine.  The spinal needle was inserted toward the target using a trajectory view along the fluoroscope beam.  Under AP and lateral visualization, the needle was advanced so it did not puncture dura and was located close the 6 O'Clock position of the pedical in AP tracterory. Biplanar projections were used to confirm position. Aspiration was confirmed to be negative for CSF and/or blood. A 1-2 ml. volume of Isovue-250 was injected and flow of contrast was noted at each level. Radiographs were obtained for documentation purposes.   After attaining the desired  flow of contrast documented above, a 0.5 to 1.0 ml test dose of 0.25% Marcaine was injected into each respective transforaminal space.  The patient was observed for 90 seconds post injection.  After no sensory deficits were reported, and normal lower extremity motor function was noted,   the above injectate was administered so that equal amounts of the injectate were placed at each foramen (level) into the transforaminal epidural space.   Additional Comments:  The patient tolerated the procedure well Dressing: 2 x 2 sterile gauze and Band-Aid    Post-procedure details: Patient was observed during the procedure. Post-procedure instructions were reviewed.  Patient left the clinic in stable condition.

## 2024-06-28 NOTE — Progress Notes (Signed)
 Pain Scale   Average Pain 8 Patient advising he has lower back pain radiating to left leg and pain is constant.        +Driver, -BT, -Dye Allergies.

## 2024-07-22 DIAGNOSIS — I10 Essential (primary) hypertension: Secondary | ICD-10-CM | POA: Diagnosis not present

## 2024-07-22 DIAGNOSIS — E785 Hyperlipidemia, unspecified: Secondary | ICD-10-CM | POA: Diagnosis not present

## 2024-08-16 ENCOUNTER — Encounter (HOSPITAL_COMMUNITY): Payer: Self-pay

## 2024-08-16 ENCOUNTER — Other Ambulatory Visit: Payer: Self-pay

## 2024-08-16 ENCOUNTER — Emergency Department (HOSPITAL_COMMUNITY)
Admission: EM | Admit: 2024-08-16 | Discharge: 2024-08-16 | Disposition: A | Discharge status: Home / Self Care | Attending: Emergency Medicine | Admitting: Emergency Medicine

## 2024-08-16 DIAGNOSIS — W268XXA Contact with other sharp object(s), not elsewhere classified, initial encounter: Secondary | ICD-10-CM | POA: Diagnosis not present

## 2024-08-16 DIAGNOSIS — S61216A Laceration without foreign body of right little finger without damage to nail, initial encounter: Secondary | ICD-10-CM | POA: Diagnosis not present

## 2024-08-16 DIAGNOSIS — Y990 Civilian activity done for income or pay: Secondary | ICD-10-CM | POA: Diagnosis not present

## 2024-08-16 DIAGNOSIS — Z23 Encounter for immunization: Secondary | ICD-10-CM | POA: Insufficient documentation

## 2024-08-16 DIAGNOSIS — S6991XA Unspecified injury of right wrist, hand and finger(s), initial encounter: Secondary | ICD-10-CM | POA: Diagnosis present

## 2024-08-16 MED ORDER — TETANUS-DIPHTH-ACELL PERTUSSIS 5-2-15.5 LF-MCG/0.5 IM SUSP
0.5000 mL | Freq: Once | INTRAMUSCULAR | Status: AC
  Administered 2024-08-16: 0.5 mL via INTRAMUSCULAR
  Filled 2024-08-16: qty 0.5

## 2024-08-16 MED ORDER — CEPHALEXIN 500 MG PO CAPS: 500.0000 mg | ORAL_CAPSULE | Freq: Two times a day (BID) | ORAL | 0 refills | Status: AC

## 2024-08-16 MED ORDER — CEPHALEXIN 500 MG PO CAPS: 500.0000 mg | ORAL_CAPSULE | Freq: Once | ORAL | Status: DC

## 2024-08-16 MED ORDER — POVIDONE-IODINE 10 % EX OINT
TOPICAL_OINTMENT | Freq: Two times a day (BID) | CUTANEOUS | Status: DC | PRN
Start: 2024-08-16 — End: 2024-08-16
  Filled 2024-08-16: qty 28.35

## 2024-08-16 MED ORDER — LIDOCAINE HCL (PF) 1 % IJ SOLN
5.0000 mL | Freq: Once | INTRAMUSCULAR | Status: AC
  Administered 2024-08-16: 5 mL
  Filled 2024-08-16: qty 5

## 2024-08-16 NOTE — ED Triage Notes (Signed)
 Pt to er, pt states that he was at work and cut his R pinky finger, bleeding is controled at this time, dressing placed, pt has aprox 1 inch lac to his R pinky finger.

## 2024-08-16 NOTE — Discharge Instructions (Signed)
 Keep your wound clean and dry, wear the splint for the next several days to minimize flexing the finger which might stress the wound edges.  Take the full course of the antibiotics prescribed.  Have your stitches removed in 10 days.  Get rechecked sooner for any signs of infection including swelling, increased pain or drainage of pus.

## 2024-08-16 NOTE — ED Notes (Signed)
 Patient had to leave so he refused meds and splint but dressing applied to finger. Patient refused discharge vital signs he stated he had an appointment he has to get to.

## 2024-08-18 NOTE — ED Provider Notes (Signed)
 [place] EMERGENCY DEPARTMENT AT [facility] Provider Note   CSN: [id number] Arrival date & time: 08/16/24  1240     Patient presents with: Laceration   Carlos Vargas is a 73 y.o. male presenting for evaluation of laceration to his fifth finger which occurred at work yesterday evening.  He has had no treatment for this injury but dressing has been applied.  He denies weakness or numbness in his distal finger and has no other complaint of injury.  He is out of date with his tetanus vaccination.   The history is provided by the patient.       Prior to Admission medications   Medication Sig Start Date End Date Taking? Authorizing Provider  cephALEXin (KEFLEX) 500 MG capsule Take 1 capsule (500 mg total) by mouth 2 (two) times daily for 7 days. 08/16/24 08/23/24 Yes Raul Torrance, PA-C  amLODipine (NORVASC) 2.5 MG tablet Take 2.5 mg by mouth every morning. 12/31/22   [provider]  atorvastatin (LIPITOR) 20 MG tablet Take 20 mg by mouth at bedtime. 01/15/23   [provider]  cyclobenzaprine  (FLEXERIL ) 10 MG tablet Take 0.5-1 tablets (5-10 mg total) by mouth 3 (three) times daily as needed. 11/23/23   Vivienne Delon HERO, PA-C  ibuprofen (ADVIL) 800 MG tablet Take 800 mg by mouth every 8 (eight) hours as needed for mild pain (pain score 1-3). 02/03/23   [provider]  methylPREDNISolone  (MEDROL  DOSEPAK) 4 MG TBPK tablet Taper over 6 days, dispense 21 tabs 04/08/24   Daralene Lonni BIRCH, PA-C  naproxen  (NAPROSYN ) 500 MG tablet Take 1 tablet (500 mg total) by mouth 2 (two) times daily with a meal. 11/23/23   Burnette, Delon HERO, PA-C  nystatin-triamcinolone (MYCOLOG II) cream Apply 1 Application topically 3 (three) times daily. 10/14/23   [provider]  omeprazole (PRILOSEC) 20 MG capsule Take 20 mg by mouth every morning. 01/20/24   [provider]  pregabalin  (LYRICA ) 75 MG capsule Take 1 capsule (75 mg total) by mouth 2 (two) times  daily. 06/15/24 09/13/24  Georgina Ozell LABOR, MD  tadalafil (CIALIS) 5 MG tablet Take 5 mg by mouth daily as needed. 05/21/23   [provider]  traMADol  (ULTRAM ) 50 MG tablet Take 1 tablet (50 mg total) by mouth every 6 (six) hours as needed for severe pain (pain score 7-10). 06/15/24   Georgina Ozell LABOR, MD    Allergies: Patient has no known allergies.    Review of Systems  Skin:  Positive for wound.  Neurological:  Negative for numbness.  All other systems reviewed and are negative.   Updated Vital Signs BP (!) 154/81 (BP Location: Left Arm)   Pulse 69   Temp 98.6 F (37 C) (Oral)   Resp 18   Ht 5' 11.5 (1.816 m)   Wt 79.8 kg   SpO2 97%   BMI 24.20 kg/m   Physical Exam Constitutional:      Appearance: He is well-developed.  HENT:     Head: Normocephalic.  Cardiovascular:     Rate and Rhythm: Normal rate.  Pulmonary:     Effort: Pulmonary effort is normal.  Skin:    Findings: Laceration present.     Comments: 3 cm laceration dorsal, lateral extending to the ulnar edge of the volar surface of his right fifth digit, just proximal to the PIP joint.  Wound is hemostatic, through the dermal layer, no deeper structures are identified or visualized.  Distal sensation is intact.  Neurological:  Mental Status: He is alert and oriented to person, place, and time.     Sensory: No sensory deficit.     (all labs ordered are listed, but only abnormal results are displayed) Labs Reviewed - No data to display  EKG: None  Radiology: No results found.   Procedures   LACERATION REPAIR Performed by: Mliss Narrow Authorized by: Mliss Narrow Consent: Verbal consent obtained. Risks and benefits: risks, benefits and alternatives were discussed Consent given by: patient Patient identity confirmed: provided demographic data Prepped and Draped in normal sterile fashion Wound explored  Laceration Location: right fifth finger  Laceration Length: 3 cm  No Foreign Bodies  seen or palpated  Anesthesia: digital block  Local anesthetic: lidocaine  1% without epinephrine  Anesthetic total: 4 ml  Irrigation method: syringe Amount of cleaning: copious syringe irrigation after betadine scrub  Skin closure: prolene 4-0  Number of sutures: 7  Technique: simple interrupted  Patient tolerance: Patient tolerated the procedure well with no immediate complications.  Medications Ordered in the ED  lidocaine  (PF) (XYLOCAINE ) 1 % injection 5 mL (5 mLs Other Given 08/16/24 1651)  Tdap (ADACEL) injection 0.5 mL (0.5 mLs Intramuscular Given 08/16/24 1727)                                    Medical Decision Making Patient presenting with simple laceration to his right fifth digit, he tolerated suturing well.  Given the location of the wound he was offered a splint to minimize stress on the wound edges, patient deferred.  Given that age of the wound he was placed on Keflex for prophylaxis against infection.  Return precautions were outlined, encouraged suture removal in 10 days but recheck sooner for any signs of infection.  His tetanus was updated.  Risk Prescription drug management.        Final diagnoses:  Laceration of right little finger without foreign body without damage to nail, initial encounter    ED Discharge Orders          Ordered    cephALEXin (KEFLEX) 500 MG capsule  2 times daily        08/16/24 1730               Kizzie Cotten, PA-C 08/18/24 1311    Suzette Pac, MD 08/19/24 1438

## 2024-08-25 DIAGNOSIS — E785 Hyperlipidemia, unspecified: Secondary | ICD-10-CM | POA: Diagnosis not present

## 2024-08-25 DIAGNOSIS — K219 Gastro-esophageal reflux disease without esophagitis: Secondary | ICD-10-CM | POA: Diagnosis not present

## 2024-08-25 DIAGNOSIS — I10 Essential (primary) hypertension: Secondary | ICD-10-CM | POA: Diagnosis not present

## 2024-08-25 DIAGNOSIS — M62830 Muscle spasm of back: Secondary | ICD-10-CM | POA: Diagnosis not present

## 2024-09-05 ENCOUNTER — Encounter: Payer: Self-pay | Admitting: Radiology

## 2024-09-25 DIAGNOSIS — I10 Essential (primary) hypertension: Secondary | ICD-10-CM | POA: Diagnosis not present

## 2024-09-25 DIAGNOSIS — E785 Hyperlipidemia, unspecified: Secondary | ICD-10-CM | POA: Diagnosis not present
